# Patient Record
Sex: Female | Born: 1935 | Race: Black or African American | Hispanic: No | Smoking: Former smoker
Health system: Southern US, Community
[De-identification: ages and names within clinical notes are randomized; demographics above are authoritative.]

## PROBLEM LIST (undated history)

## (undated) DIAGNOSIS — E119 Type 2 diabetes mellitus without complications: Secondary | ICD-10-CM

## (undated) DIAGNOSIS — J45909 Unspecified asthma, uncomplicated: Secondary | ICD-10-CM

## (undated) DIAGNOSIS — I10 Essential (primary) hypertension: Secondary | ICD-10-CM

---

## 2004-11-03 ENCOUNTER — Ambulatory Visit (HOSPITAL_COMMUNITY): Admission: RE | Admit: 2004-11-03 | Discharge: 2004-11-03 | Payer: Self-pay | Admitting: Family Medicine

## 2005-01-13 ENCOUNTER — Inpatient Hospital Stay (HOSPITAL_COMMUNITY): Admission: EM | Admit: 2005-01-13 | Discharge: 2005-01-22 | Payer: Self-pay | Admitting: Emergency Medicine

## 2012-11-10 ENCOUNTER — Emergency Department (HOSPITAL_COMMUNITY): Payer: Medicare Other

## 2012-11-10 ENCOUNTER — Inpatient Hospital Stay (HOSPITAL_COMMUNITY)
Admission: EM | Admit: 2012-11-10 | Discharge: 2012-11-18 | DRG: 330 | Disposition: A | Discharge status: Home / Self Care | Payer: Medicare Other | Attending: Surgery | Admitting: Surgery

## 2012-11-10 ENCOUNTER — Encounter (HOSPITAL_COMMUNITY): Payer: Self-pay

## 2012-11-10 ENCOUNTER — Emergency Department (HOSPITAL_COMMUNITY)
Admission: EM | Admit: 2012-11-10 | Discharge: 2012-11-10 | Disposition: A | Discharge status: Nursing Home (Includes ICF) | Payer: Medicare Other | Source: Home / Self Care | Attending: Family Medicine | Admitting: Family Medicine

## 2012-11-10 ENCOUNTER — Inpatient Hospital Stay (HOSPITAL_COMMUNITY): Payer: Medicare Other

## 2012-11-10 ENCOUNTER — Encounter (HOSPITAL_COMMUNITY): Payer: Self-pay | Admitting: *Deleted

## 2012-11-10 DIAGNOSIS — R1012 Left upper quadrant pain: Secondary | ICD-10-CM

## 2012-11-10 DIAGNOSIS — K56609 Unspecified intestinal obstruction, unspecified as to partial versus complete obstruction: Secondary | ICD-10-CM | POA: Diagnosis present

## 2012-11-10 DIAGNOSIS — I10 Essential (primary) hypertension: Secondary | ICD-10-CM | POA: Diagnosis present

## 2012-11-10 DIAGNOSIS — R1011 Right upper quadrant pain: Secondary | ICD-10-CM

## 2012-11-10 DIAGNOSIS — IMO0002 Reserved for concepts with insufficient information to code with codable children: Secondary | ICD-10-CM | POA: Diagnosis present

## 2012-11-10 DIAGNOSIS — M799 Soft tissue disorder, unspecified: Secondary | ICD-10-CM | POA: Diagnosis present

## 2012-11-10 DIAGNOSIS — Z79899 Other long term (current) drug therapy: Secondary | ICD-10-CM

## 2012-11-10 DIAGNOSIS — T183XXA Foreign body in small intestine, initial encounter: Principal | ICD-10-CM | POA: Diagnosis present

## 2012-11-10 DIAGNOSIS — J4489 Other specified chronic obstructive pulmonary disease: Secondary | ICD-10-CM | POA: Diagnosis present

## 2012-11-10 DIAGNOSIS — E119 Type 2 diabetes mellitus without complications: Secondary | ICD-10-CM | POA: Diagnosis present

## 2012-11-10 DIAGNOSIS — J449 Chronic obstructive pulmonary disease, unspecified: Secondary | ICD-10-CM | POA: Diagnosis present

## 2012-11-10 HISTORY — DX: Unspecified asthma, uncomplicated: J45.909

## 2012-11-10 HISTORY — DX: Type 2 diabetes mellitus without complications: E11.9

## 2012-11-10 HISTORY — DX: Essential (primary) hypertension: I10

## 2012-11-10 LAB — CBC WITH DIFFERENTIAL/PLATELET
Basophils Relative: 0 % (ref 0–1)
Eosinophils Absolute: 0 10*3/uL (ref 0.0–0.7)
Eosinophils Relative: 0 % (ref 0–5)
HCT: 37.4 % (ref 36.0–46.0)
Hemoglobin: 11.9 g/dL — ABNORMAL LOW (ref 12.0–15.0)
Lymphocytes Relative: 4 % — ABNORMAL LOW (ref 12–46)
Lymphs Abs: 0.7 10*3/uL (ref 0.7–4.0)
MCH: 27.9 pg (ref 26.0–34.0)
MCHC: 31.8 g/dL (ref 30.0–36.0)
MCV: 87.8 fL (ref 78.0–100.0)
Monocytes Absolute: 1.2 10*3/uL — ABNORMAL HIGH (ref 0.1–1.0)
Monocytes Relative: 7 % (ref 3–12)
Neutro Abs: 16.7 10*3/uL — ABNORMAL HIGH (ref 1.7–7.7)
Platelets: 511 10*3/uL — ABNORMAL HIGH (ref 150–400)
RBC: 4.26 MIL/uL (ref 3.87–5.11)
RDW: 13.1 % (ref 11.5–15.5)
WBC: 18.6 10*3/uL — ABNORMAL HIGH (ref 4.0–10.5)

## 2012-11-10 LAB — COMPREHENSIVE METABOLIC PANEL
ALT: 16 U/L (ref 0–35)
Albumin: 3.1 g/dL — ABNORMAL LOW (ref 3.5–5.2)
Alkaline Phosphatase: 55 U/L (ref 39–117)
CO2: 26 mEq/L (ref 19–32)
Calcium: 10.5 mg/dL (ref 8.4–10.5)
Chloride: 87 mEq/L — ABNORMAL LOW (ref 96–112)
Creatinine, Ser: 2.05 mg/dL — ABNORMAL HIGH (ref 0.50–1.10)
GFR calc Af Amer: 26 mL/min — ABNORMAL LOW (ref >90)
GFR calc non Af Amer: 23 mL/min — ABNORMAL LOW (ref >90)
Glucose, Bld: 75 mg/dL (ref 70–99)
Potassium: 5 mEq/L (ref 3.5–5.1)
Sodium: 131 mEq/L — ABNORMAL LOW (ref 135–145)
Total Bilirubin: 0.4 mg/dL (ref 0.3–1.2)
Total Protein: 8.2 g/dL (ref 6.0–8.3)

## 2012-11-10 LAB — LIPASE, BLOOD: Lipase: 23 U/L (ref 11–59)

## 2012-11-10 MED ORDER — MENTHOL 3 MG MT LOZG
1.0000 | LOZENGE | OROMUCOSAL | Status: DC | PRN
  Filled 2012-11-10: qty 9

## 2012-11-10 MED ORDER — ONDANSETRON HCL 4 MG/2ML IJ SOLN
4.0000 mg | Freq: Once | INTRAMUSCULAR | Status: AC
  Administered 2012-11-10: 4 mg via INTRAVENOUS
  Filled 2012-11-10: qty 2

## 2012-11-10 MED ORDER — ALBUTEROL SULFATE HFA 108 (90 BASE) MCG/ACT IN AERS: 2.0000 | INHALATION_SPRAY | Freq: Four times a day (QID) | RESPIRATORY_TRACT | Status: DC | PRN

## 2012-11-10 MED ORDER — ONDANSETRON HCL 4 MG/2ML IJ SOLN: 4.0000 mg | Freq: Four times a day (QID) | INTRAMUSCULAR | Status: DC | PRN

## 2012-11-10 MED ORDER — SODIUM CHLORIDE 0.9 % IV SOLN
Freq: Once | INTRAVENOUS | Status: AC
  Administered 2012-11-10: 16:00:00 via INTRAVENOUS

## 2012-11-10 MED ORDER — POTASSIUM CHLORIDE IN NACL 20-0.45 MEQ/L-% IV SOLN
INTRAVENOUS | Status: DC
  Administered 2012-11-11: via INTRAVENOUS
  Filled 2012-11-10 (×2): qty 1000

## 2012-11-10 MED ORDER — DIPHENHYDRAMINE HCL 12.5 MG/5ML PO ELIX
12.5000 mg | ORAL_SOLUTION | Freq: Four times a day (QID) | ORAL | Status: DC | PRN
  Filled 2012-11-10: qty 10

## 2012-11-10 MED ORDER — HEPARIN SODIUM (PORCINE) 5000 UNIT/ML IJ SOLN
5000.0000 [IU] | Freq: Three times a day (TID) | INTRAMUSCULAR | Status: DC
  Administered 2012-11-11 – 2012-11-18 (×20): 5000 [IU] via SUBCUTANEOUS
  Filled 2012-11-10 (×27): qty 1

## 2012-11-10 MED ORDER — PANTOPRAZOLE SODIUM 40 MG IV SOLR
40.0000 mg | Freq: Every day | INTRAVENOUS | Status: DC
  Administered 2012-11-11 – 2012-11-15 (×6): 40 mg via INTRAVENOUS
  Filled 2012-11-10 (×8): qty 40

## 2012-11-10 MED ORDER — DIPHENHYDRAMINE HCL 50 MG/ML IJ SOLN: 12.5000 mg | Freq: Four times a day (QID) | INTRAMUSCULAR | Status: DC | PRN

## 2012-11-10 MED ORDER — ONDANSETRON HCL 4 MG/2ML IJ SOLN
INTRAMUSCULAR | Status: AC
  Filled 2012-11-10: qty 2

## 2012-11-10 MED ORDER — SODIUM CHLORIDE 0.9 % IV BOLUS (SEPSIS): 1000.0000 mL | Freq: Once | INTRAVENOUS | Status: DC

## 2012-11-10 MED ORDER — ONDANSETRON HCL 4 MG/2ML IJ SOLN
4.0000 mg | Freq: Once | INTRAMUSCULAR | Status: AC
  Administered 2012-11-10: 4 mg via INTRAVENOUS

## 2012-11-10 MED ORDER — MORPHINE SULFATE 2 MG/ML IJ SOLN
2.0000 mg | INTRAMUSCULAR | Status: DC | PRN
  Administered 2012-11-12: 2 mg via INTRAVENOUS
  Filled 2012-11-10: qty 1

## 2012-11-10 MED ORDER — IOHEXOL 300 MG/ML  SOLN
20.0000 mL | INTRAMUSCULAR | Status: AC
  Administered 2012-11-10: 20 mL via ORAL

## 2012-11-10 MED ORDER — INSULIN ASPART 100 UNIT/ML ~~LOC~~ SOLN
0.0000 [IU] | SUBCUTANEOUS | Status: DC
  Administered 2012-11-11: 1 [IU] via SUBCUTANEOUS
  Administered 2012-11-12: 2 [IU] via SUBCUTANEOUS
  Administered 2012-11-12 – 2012-11-13 (×3): 1 [IU] via SUBCUTANEOUS

## 2012-11-10 NOTE — ED Notes (Signed)
Patient transported to CT 

## 2012-11-10 NOTE — ED Notes (Addendum)
Patient transported to x-ray. ?

## 2012-11-10 NOTE — ED Notes (Signed)
Per [facility], pt has had abdominal pain for the past two days; pt had previous episode around thanksgiving with n/v/d; pt denying any nausea or diarrhea; pt is tachycardic with hr in the 110-120 and hypotensive for her at 105/60; pt denying bloody stools or hematuria; nad;

## 2012-11-10 NOTE — ED Notes (Signed)
Informed pt about importance of drinking oral contrast for CT; CT verbalized understanding and stated she would try to drink more;

## 2012-11-10 NOTE — ED Notes (Signed)
Pt attempted to use the bathroom but was unsuccessful. Pt ambulated to the restroom with minimal assistance.

## 2012-11-10 NOTE — ED Provider Notes (Addendum)
History     CSN: [id number]  Arrival date & time 11/10/12  1638   First MD Initiated Contact with Patient 11/10/12 1743      Chief Complaint  Patient presents with  . Abdominal Pain    (Consider location/radiation/quality/duration/timing/severity/associated sxs/prior treatment) HPI  Patient complaining of diffuse abdominal pain began three weeks ago,now constant for two days.  Patient unable to eat solids and has not had bowel movement for three days.  Abdomen is distended.  Vomited yesterday, but not today, no solids today.  No fever or chills.  No uti symptoms.  PMD Dr. Thea Silversmith.    Past Medical History  Diagnosis Date  . Diabetes mellitus without complication   . Hypertension   . Asthma     History reviewed. No pertinent past surgical history.  Family History  Problem Relation Age of Onset  . Cancer Mother   . Heart failure Father     History  Substance Use Topics  . Smoking status: Never Smoker   . Smokeless tobacco: Not on file  . Alcohol Use: No    OB History    Grav Para Term Preterm Abortions TAB SAB Ect Mult Living                  Review of Systems  Constitutional: Negative for fever, chills, activity change, appetite change and unexpected weight change.  HENT: Negative for sore throat, rhinorrhea, neck pain, neck stiffness and sinus pressure.   Eyes: Negative for visual disturbance.  Respiratory: Negative for cough and shortness of breath.   Cardiovascular: Negative for chest pain and leg swelling.  Gastrointestinal: Positive for nausea, vomiting, abdominal pain and abdominal distention. Negative for diarrhea and blood in stool.  Genitourinary: Negative for dysuria, urgency, frequency, vaginal discharge and difficulty urinating.  Musculoskeletal: Negative for myalgias, arthralgias and gait problem.  Skin: Negative for color change and rash.  Neurological: Negative for weakness, light-headedness and headaches.  Hematological: Does not bruise/bleed  easily.  Psychiatric/Behavioral: Negative for dysphoric mood.    Allergies  Penicillins  Home Medications   Current Outpatient Rx  Name  Route  Sig  Dispense  Refill  . ALBUTEROL SULFATE HFA 108 (90 BASE) MCG/ACT IN AERS   Inhalation   Inhale 2 puffs into the lungs every 6 (six) hours as needed.         Marland Kitchen GLIPIZIDE 10 MG PO TABS   Oral   Take 10 mg by mouth daily with breakfast.         . METFORMIN HCL 500 MG PO TABS   Oral   Take 500 mg by mouth daily after supper.           BP 124/76  Pulse 110  Temp 98.4 F (36.9 C) (Oral)  Resp 22  Ht 5' 5.5" (1.664 m)  Wt 175 lb (79.379 kg)  BMI 28.68 kg/m2  SpO2 96%  Physical Exam  Nursing note and vitals reviewed. Constitutional: She appears well-developed and well-nourished.  HENT:  Head: Normocephalic and atraumatic.  Eyes: Conjunctivae normal and EOM are normal. Pupils are equal, round, and reactive to light.  Neck: Normal range of motion. Neck supple.  Cardiovascular: Regular rhythm, normal heart sounds and intact distal pulses.  Tachycardia present.   Pulmonary/Chest: Effort normal and breath sounds normal.  Abdominal: She exhibits distension. There is tenderness.       No bowel sounds.   Musculoskeletal: Normal range of motion.  Neurological: She is alert.  Skin: Skin is warm and  dry.  Psychiatric: She has a normal mood and affect. Thought content normal.    ED Course  Procedures (including critical care time)  Labs Reviewed - No data to display No results found.   No diagnosis found.    MDM  7:22 PM Discussed radiograph results with Dr. Janee Morn and he will be in to evaluate.        Hilario Quarry, MD 11/10/12 [phone]  Discussed results with patient, ct pending.  She states she only has pain with movement and does not need pain medicine at this time.    Hilario Quarry, MD 11/10/12 2009

## 2012-11-10 NOTE — ED Notes (Signed)
MD at bedside. 

## 2012-11-10 NOTE — ED Notes (Signed)
1610  Report given to Medical laboratory scientific officer for [facility] by phone.

## 2012-11-10 NOTE — ED Notes (Signed)
Attempted report once again; bedside report will be done;

## 2012-11-10 NOTE — ED Notes (Signed)
Attempted to call report for second time 

## 2012-11-10 NOTE — ED Notes (Signed)
C/o vomiting all day Thanksgiving got better and got sick again 2 days ago.  Vomited 2 days ago x 1, does not remember if she vomited yesterday none today.  C/o pain in epigastric area and LUQ abd.  C/o hiccups frequently for 1 week.  No chills or fever,  No blood in vomit or stools.  No diarrhea.

## 2012-11-10 NOTE — ED Provider Notes (Addendum)
History     CSN: [id number]  Arrival date & time 11/10/12  1517   First MD Initiated Contact with Patient 11/10/12 1540      Chief Complaint  Patient presents with  . Abdominal Pain    (Consider location/radiation/quality/duration/timing/severity/associated sxs/prior treatment) Patient is a 77 y.o. female presenting with abdominal pain. The history is provided by the patient.  Abdominal Pain The primary symptoms of the illness include abdominal pain, vomiting and diarrhea. The primary symptoms of the illness do not include fever, hematemesis, hematochezia or dysuria. The current episode started 2 days ago (similar hx over Thanksgiving with persistent vomiting, improved a little but relapsed 2 d ago., persistent anorexia, only tolerating warm milk.Marland Kitchen). The onset of the illness was sudden. The problem has been gradually worsening.  Symptoms associated with the illness do not include chills.    No past medical history on file.  No past surgical history on file.  No family history on file.  History  Substance Use Topics  . Smoking status: Not on file  . Smokeless tobacco: Not on file  . Alcohol Use: Not on file    OB History    No data available      Review of Systems  Constitutional: Positive for appetite change. Negative for fever and chills.  Cardiovascular: Negative for chest pain.  Gastrointestinal: Positive for vomiting, abdominal pain and diarrhea. Negative for blood in stool, hematochezia and hematemesis.  Genitourinary: Negative.  Negative for dysuria.    Allergies  Review of patient's allergies indicates not on file.  Home Medications  No current outpatient prescriptions on file.  BP 105/61  Pulse 119  Temp 98.5 F (36.9 C) (Oral)  Resp 20  SpO2 97%  Physical Exam  Nursing note and vitals reviewed. Constitutional: She is oriented to person, place, and time. She appears well-developed and well-nourished. She appears distressed.  HENT:  Mouth/Throat:  Oropharynx is clear and moist.  Eyes: Conjunctivae normal are normal. Pupils are equal, round, and reactive to light.  Neck: Normal range of motion. Neck supple.  Cardiovascular: Regular rhythm.   Abdominal: She exhibits no distension and no mass. There is no hepatosplenomegaly. There is tenderness in the epigastric area and left upper quadrant. There is guarding. There is no rebound, no CVA tenderness, no tenderness at McBurney's point and negative Murphy's sign.  Musculoskeletal: She exhibits no edema and no tenderness.  Neurological: She is alert and oriented to person, place, and time.  Skin: Skin is warm and dry.    ED Course  Procedures (including critical care time)  Labs Reviewed - No data to display No results found.   1. Abdominal pain, bilateral upper quadrant       MDM          Linna Hoff, MD 11/10/12 1559  Linna Hoff, MD 11/10/12 [phone]

## 2012-11-10 NOTE — ED Notes (Addendum)
IV @ KVO after Zofran infused.  [facility] here.  Daughter notified of transfer and is at the bedside.  Given her belongings.

## 2012-11-10 NOTE — ED Notes (Signed)
Pt back from CT

## 2012-11-10 NOTE — ED Notes (Signed)
Returned from radiology. 

## 2012-11-10 NOTE — H&P (Signed)
Dawn [name] is an 77 y.o. female.   Chief Complaint: Abdominal pain and distention HPI: Patient has a three-week history of crampy abdominal pain. It was intermittent. For the past few days, it has increased in frequency. She has been passing gas. She has not had a bowel movement for a couple days. She noticed her abdomen become distended. She went to urgent care and was referred to the [facility] emergency department. Plain films show small bowel obstruction I was asked to see her in the emergency department. She denies having surgery in the past.  Past Medical History  Diagnosis Date  . Diabetes mellitus without complication   . Hypertension   . Asthma     History reviewed. No pertinent past surgical history.  Family History  Problem Relation Age of Onset  . Cancer Mother   . Heart failure Father    Social History:  reports that she has never smoked. She does not have any smokeless tobacco history on file. She reports that she does not drink alcohol or use illicit drugs.  Allergies:  Allergies  Allergen Reactions  . Penicillins     Got sick and couldn't breathe     (Not in a hospital admission)  Results for orders placed during the hospital encounter of 11/10/12 (from the past 48 hour(s))  CBC WITH DIFFERENTIAL     Status: Abnormal   Collection Time   11/10/12  6:02 PM      Component Value Range Comment   WBC 18.6 (*) 4.0 - 10.5 K/uL    RBC 4.26  3.87 - 5.11 MIL/uL    Hemoglobin 11.9 (*) 12.0 - 15.0 g/dL    HCT 16.1  09.6 - 04.5 %    MCV 87.8  78.0 - 100.0 fL    MCH 27.9  26.0 - 34.0 pg    MCHC 31.8  30.0 - 36.0 g/dL    RDW 40.9  81.1 - 91.4 %    Platelets 511 (*) 150 - 400 K/uL    Neutrophils Relative 90 (*) 43 - 77 %    Neutro Abs 16.7 (*) 1.7 - 7.7 K/uL    Lymphocytes Relative 4 (*) 12 - 46 %    Lymphs Abs 0.7  0.7 - 4.0 K/uL    Monocytes Relative 7  3 - 12 %    Monocytes Absolute 1.2 (*) 0.1 - 1.0 K/uL    Eosinophils Relative 0  0 - 5 %    Eosinophils  Absolute 0.0  0.0 - 0.7 K/uL    Basophils Relative 0  0 - 1 %    Basophils Absolute 0.0  0.0 - 0.1 K/uL   COMPREHENSIVE METABOLIC PANEL     Status: Abnormal   Collection Time   11/10/12  6:02 PM      Component Value Range Comment   Sodium 131 (*) 135 - 145 mEq/L    Potassium 5.0  3.5 - 5.1 mEq/L    Chloride 87 (*) 96 - 112 mEq/L    CO2 26  19 - 32 mEq/L    Glucose, Bld 75  70 - 99 mg/dL    BUN 44 (*) 6 - 23 mg/dL    Creatinine, Ser 7.82 (*) 0.50 - 1.10 mg/dL    Calcium 95.6  8.4 - 10.5 mg/dL    Total Protein 8.2  6.0 - 8.3 g/dL    Albumin 3.1 (*) 3.5 - 5.2 g/dL    AST 17  0 - 37 U/L  ALT 16  0 - 35 U/L    Alkaline Phosphatase 55  39 - 117 U/L    Total Bilirubin 0.4  0.3 - 1.2 mg/dL    GFR calc non Af Amer 23 (*) >90 mL/min    GFR calc Af Amer 26 (*) >90 mL/min   LIPASE, BLOOD     Status: Normal   Collection Time   11/10/12  6:02 PM      Component Value Range Comment   Lipase 23  11 - 59 U/L    Dg Abd Acute W/chest  11/10/2012  *RADIOLOGY REPORT*  Clinical Data: Abdominal pain and weakness  ACUTE ABDOMEN SERIES (ABDOMEN 2 VIEW & CHEST 1 VIEW)  Comparison: Chest 01/20/2005  Findings: Small bowel dilatation with air-fluid levels compatible with small bowel obstruction.  Negative for free air.  Colon is decompressed with a small amount of gas in the rectum.  Left lower lobe density is present which is most likely due to a Bochdalek hernia as seen on prior lateral chest x-rays.  There is mild left lower lobe atelectasis.  Right lung is clear.  Negative for heart failure.  IMPRESSION: Left lower lobe density due to atelectasis and the posterior diaphragmatic hernia.  Small bowel obstruction   Original Report Authenticated By: Janeece Riggers, M.D.     Review of Systems  Constitutional: Negative.   HENT: Negative.   Eyes: Negative.   Respiratory: Negative.   Cardiovascular: Negative.   Gastrointestinal: Positive for nausea, vomiting and abdominal pain.  Genitourinary:       Decreased  urinary frequency for the past day  Musculoskeletal:       Long history left forearm soft tissue mass  Skin: Negative.   Neurological: Negative.   Endo/Heme/Allergies: Negative.     Blood pressure 85/60, [name] 107, temperature 98.4 F (36.9 C), temperature source Oral, resp. rate 27, height 5' 5.5" (1.664 m), weight 79.379 kg (175 lb), SpO2 96.00%. Physical Exam  Constitutional: She is oriented to person, place, and time. She appears well-developed and well-nourished. No distress.  HENT:  Head: Normocephalic and atraumatic.  Mouth/Throat: No oropharyngeal exudate.  Eyes: EOM are normal. Pupils are equal, round, and reactive to light. No scleral icterus.  Neck: Normal range of motion. Neck supple. No tracheal deviation present.  Cardiovascular: Regular rhythm, normal heart sounds and intact distal pulses.   No murmur heard.      Heart rate 108  Respiratory: Effort normal and breath sounds normal. No stridor. No respiratory distress. She has no wheezes. She has no rales.  GI: Soft. She exhibits distension. There is tenderness. There is no rebound and no guarding.       Mild tenderness left side greater than right side, no guarding, active bowel sounds are present  Musculoskeletal:       3 cm soft tissue mass left forearm  Neurological: She is alert and oriented to person, place, and time.  Skin: Skin is warm and dry.     Assessment/Plan Small bowel obstruction. CT scan is ordered, will followup on that. Will admit to the hospital, place nasogastric tube to low intermittent suction, rehydrate with IV fluids as I believe she is dehydrated. We will follow her exam and laboratory studies closely. I explained to her she may need surgery if she does not improve over the next 24-48 hours. I also spoke with her family members. I answered their questions.  Etta Gassett E 11/10/2012, 8:00 PM

## 2012-11-10 NOTE — ED Notes (Signed)
Family at bedside. 

## 2012-11-10 NOTE — ED Notes (Signed)
Bedside report given to Misty Stanley, [place]

## 2012-11-11 ENCOUNTER — Encounter (HOSPITAL_COMMUNITY): Payer: Self-pay | Admitting: *Deleted

## 2012-11-11 LAB — CBC
MCH: 28.7 pg (ref 26.0–34.0)
MCHC: 32.8 g/dL (ref 30.0–36.0)
MCV: 87.3 fL (ref 78.0–100.0)
Platelets: 498 10*3/uL — ABNORMAL HIGH (ref 150–400)

## 2012-11-11 LAB — COMPREHENSIVE METABOLIC PANEL
ALT: 12 U/L (ref 0–35)
AST: 15 U/L (ref 0–37)
Albumin: 2.7 g/dL — ABNORMAL LOW (ref 3.5–5.2)
Alkaline Phosphatase: 53 U/L (ref 39–117)
CO2: 26 mEq/L (ref 19–32)
Chloride: 90 mEq/L — ABNORMAL LOW (ref 96–112)
GFR calc non Af Amer: 24 mL/min — ABNORMAL LOW (ref >90)
Potassium: 5 mEq/L (ref 3.5–5.1)
Sodium: 128 mEq/L — ABNORMAL LOW (ref 135–145)
Total Bilirubin: 0.5 mg/dL (ref 0.3–1.2)

## 2012-11-11 LAB — URINALYSIS, ROUTINE W REFLEX MICROSCOPIC
Hgb urine dipstick: NEGATIVE
Ketones, ur: NEGATIVE mg/dL
Nitrite: NEGATIVE
Protein, ur: NEGATIVE mg/dL
Specific Gravity, Urine: 1.023 (ref 1.005–1.030)
Urobilinogen, UA: 1 mg/dL (ref 0.0–1.0)

## 2012-11-11 LAB — URINE MICROSCOPIC-ADD ON

## 2012-11-11 LAB — GLUCOSE, CAPILLARY
Glucose-Capillary: 57 mg/dL — ABNORMAL LOW (ref 70–99)
Glucose-Capillary: 174 mg/dL — ABNORMAL HIGH (ref 70–99)
Glucose-Capillary: 98 mg/dL (ref 70–99)
Glucose-Capillary: 113 mg/dL — ABNORMAL HIGH (ref 70–99)
Glucose-Capillary: 135 mg/dL — ABNORMAL HIGH (ref 70–99)

## 2012-11-11 LAB — PROTIME-INR: Prothrombin Time: 13.8 seconds (ref 11.6–15.2)

## 2012-11-11 MED ORDER — KCL IN DEXTROSE-NACL 20-5-0.45 MEQ/L-%-% IV SOLN
INTRAVENOUS | Status: DC
  Administered 2012-11-11 (×2): via INTRAVENOUS
  Filled 2012-11-11 (×7): qty 1000

## 2012-11-11 MED ORDER — DEXTROSE 50 % IV SOLN
50.0000 mL | Freq: Once | INTRAVENOUS | Status: AC
  Administered 2012-11-11: 50 mL via INTRAVENOUS

## 2012-11-11 MED ORDER — PNEUMOCOCCAL VAC POLYVALENT 25 MCG/0.5ML IJ INJ
0.5000 mL | INJECTION | INTRAMUSCULAR | Status: AC
  Administered 2012-11-13: 0.5 mL via INTRAMUSCULAR
  Filled 2012-11-11: qty 0.5

## 2012-11-11 NOTE — Progress Notes (Signed)
INITIAL NUTRITION ASSESSMENT  DOCUMENTATION CODES Per approved criteria  -Not Applicable   INTERVENTION: Monitor patient status and diet advancement as appropriate per MD.    NUTRITION DIAGNOSIS: Inadequate oral intake related to altered GI function as evidenced by npo status.   Goal: Tolerate diet advancement as appropriate  Monitor:  Diet advancement, labs, weight, nutrition plan of care  Reason for Assessment: Low Braden  77 y.o. female  Admitting Dx: SBO with recommendations for surgery if not better by am.  ASSESSMENT: Sleeping, pt with a 3 week history of crampy abdominal pain.  Well nourished prior to this event.  NPO with NG tube in place.  Height: Ht Readings from Last 1 Encounters:  11/10/12 5' 5.5" (1.664 m)    Weight: Wt Readings from Last 1 Encounters:  11/10/12 175 lb (79.379 kg)    Ideal Body Weight: 127.5 lb  % Ideal Body Weight: 137  Wt Readings from Last 10 Encounters:  11/10/12 175 lb (79.379 kg)    Usual Body Weight: unknown  % Usual Body Weight: unknown  BMI:  Body mass index is 28.68 kg/(m^2).  Estimated Nutritional Needs: Kcal: 1600-1800 Protein: 85-95 gm Fluid: 1.6-1.8L  Diet Order: NPO  EDUCATION NEEDS: -No education needs identified at this time   Intake/Output Summary (Last 24 hours) at 11/11/12 1452 Last data filed at 11/11/12 1404  Gross per 24 hour  Intake 1414.5 ml  Output    500 ml  Net  914.5 ml    Last BM: none  Labs:   Lab 11/11/12 0620 11/10/12 1802  NA 128* 131*  K 5.0 5.0  CL 90* 87*  CO2 26 26  BUN 49* 44*  CREATININE 1.93* 2.05*  CALCIUM 9.3 10.5  MG -- --  PHOS -- --  GLUCOSE 109* 75    CBG (last 3)   Basename 11/11/12 1118 11/11/12 0906 11/11/12 0517  GLUCAP 98 68* 174*    Scheduled Meds:   . heparin  5,000 Units Subcutaneous Q8H  . insulin aspart  0-9 Units Subcutaneous Q4H  . pantoprazole (PROTONIX) IV  40 mg Intravenous QHS  . pneumococcal 23 valent vaccine  0.5 mL  Intramuscular Tomorrow-1000  . sodium chloride  1,000 mL Intravenous Once    Continuous Infusions:   . dextrose 5 % and 0.45 % NaCl with KCl 20 mEq/L 125 mL/hr at 11/11/12 1610    Past Medical History  Diagnosis Date  . Diabetes mellitus without complication   . Hypertension   . Asthma     History reviewed. No pertinent past surgical history.  Oran Rein, RD, LDN Clinical Inpatient Dietitian Pager:  [phone] Weekend and after hours pager:  [phone]

## 2012-11-11 NOTE — Clinical Documentation Improvement (Signed)
RENAL FAILURE DOCUMENTATION CLARIFICATION QUERY  THIS DOCUMENT IS NOT A PERMANENT PART OF THE MEDICAL RECORD  TO RESPOND TO THE THIS QUERY, FOLLOW THE INSTRUCTIONS BELOW:  1. If needed, update documentation for the patient's encounter via the notes activity.  2. Access this query again and click edit on the In [employer].  3. After updating, or not, click F2 to complete all highlighted (required) fields concerning your review. Select "additional documentation in the medical record" OR "no additional documentation provided".  4. Click Sign note button.  5. The deficiency will fall out of your In Basket *Please let us know if you are not able to complete this workflow by phone or e-mail (listed below).  Please update your documentation within the medical record to reflect your response to this query.                                                                                    11/11/12  Dear Dr. Romeo Apple [facility]/ [facility]   In a better effort to capture your patient's severity of illness, reflect appropriate length of stay and utilization of resources, a review of the patient medical record has revealed the following indicators.    Based on your clinical judgment, please clarify and document in a progress note and/or discharge summary the clinical condition associated with the following supporting information:  In responding to this query please exercise your independent judgment.  The fact that a query is asked, does not imply that any particular answer is desired or expected.  Patient's BUN/CRT  44/ 2.05 and 49/ 1.93 on admit.  Please document the appropriate secondary diagnosis.  Thank you   Possible Clinical Conditions?   Acute Renal Failure  Acute Kidney Injury  Acute on Chronic Renal Failure  Chronic Renal Failure  Other Condition  Cannot Clinically Determine      Risk Factors: Diabetes, HTN  Signs and Symptoms:" Decreased urinary frequency for the past  day "     Lab: CMP   Current Creatinine Level (trend): 1.93 (H) 2.05   Current BUN Level (trend): 49 (H) 44 (H)  Electrolytes: Na 128/ 131   GFR: Af Amr  28 (L) 90 mL/min" 26 (   Treatments: D 5 1/2 NS w  KCL @ 125 ml /hr                     You may use possible, probable, or suspect with inpatient documentation. possible, probable, suspected diagnoses MUST be documented at the time of discharge  Reviewed:  no additional documentation provided  Thank You,  Lavonda Jumbo  Clinical Documentation Specialist, BSN: Pager [phone]  Health Information Management [place]

## 2012-11-11 NOTE — Clinical Documentation Improvement (Signed)
Abnormal Labs Clarification  THIS DOCUMENT IS NOT A PERMANENT PART OF THE MEDICAL RECORD  TO RESPOND TO THE THIS QUERY, FOLLOW THE INSTRUCTIONS BELOW:  1. If needed, update documentation for the patient's encounter via the notes activity.  2. Access this query again and click edit on the [employer].  3. After updating, or not, click F2 to complete all highlighted (required) fields concerning your review. Select "additional documentation in the medical record" OR "no additional documentation provided".  4. Click Sign note button.  5. The deficiency will fall out of your InBasket *Please let us know if you are not able to complete this workflow by phone or e-mail (listed below).  Please update your documentation within the medical record to reflect your response to this query.                                                                                   11/11/12  Dear Dr. Romeo Apple [facility] [place]/  [facility]  In a better effort to capture your patient's severity of illness, reflect appropriate length of stay and utilization of resources, a review of the medical record has revealed the following indicators.    Based on your clinical judgment, please clarify and document in a progress note and/or discharge summary the clinical condition associated with the following supporting information:  In responding to this query please exercise your independent judgment.  The fact that a query is asked, does not imply that any particular answer is desired or expected.  Abnormal findings (laboratory, x-ray, pathologic, and other diagnostic results) are not coded and reported unless the physician indicates their clinical significance.   The medical record reflects the following clinical findings, please clarify the diagnostic and/or clinical significance:      Noted Sodium level 128/ 131.  Please document appropriate secondary diagnosis. Thank you  Possible Clinical Conditions?                                    Hyponatremia  SIADH  Abnormal lab value   Other Condition:             Cannot Clinically Determine:  Treatment: D 5 1/2 NS w 20  KCL @ 125 ml/hr    Reviewed:  no additional documentation provided I think she is just dehydrated and would not classify this as renal failure  Thank You,  Lavonda Jumbo  Clinical Documentation Specialist, BSN: Pager [phone]  Health Information Management [place]

## 2012-11-11 NOTE — Progress Notes (Signed)
Patient ID: Dawn [name], female   DOB: Jan 16, 1935, 77 y.o.   MRN: [id number]    Subjective: Feels "pretty good" although on questioning states her abdominal pain is about the same.  No flatus or BM  Objective: Vital signs in last 24 hours: Temp:  [98.1 F (36.7 C)-98.5 F (36.9 C)] 98.1 F (36.7 C) (12/20 0520) Pulse Rate:  [54-120] 60  (12/20 1112) Resp:  [16-29] 20  (12/20 1112) BP: (85-126)/(51-89) 113/54 mmHg (12/20 1112) SpO2:  [93 %-100 %] 96 % (12/20 0520) Weight:  [175 lb (79.379 kg)] 175 lb (79.379 kg) (12/19 1639) Last BM Date: 11/09/12  Intake/Output from previous day: 12/19 0701 - 12/20 0700 In: 1414.5 [I.V.:662.5; NG/GT:750; IV Piggyback:2] Out: 200 [Urine:200] Intake/Output this shift: Adequate UOP today  General appearance: alert, cooperative and no distress GI: Moderately distended, mild tenderness diffusely with out guarding  Lab Results:   [facility] 11/11/12 0620 11/10/12 1802  WBC 17.1* 18.6*  HGB 11.3* 11.9*  HCT 34.4* 37.4  PLT 498* 511*   BMET  Basename 11/11/12 0620 11/10/12 1802  NA 128* 131*  K 5.0 5.0  CL 90* 87*  CO2 26 26  GLUCOSE 109* 75  BUN 49* 44*  CREATININE 1.93* 2.05*  CALCIUM 9.3 10.5     Studies/Results: Ct Abdomen Pelvis Wo Contrast  11/10/2012  *RADIOLOGY REPORT*  Clinical Data: Abdominal pain  CT ABDOMEN AND PELVIS WITHOUT CONTRAST  Technique:  Multidetector CT imaging of the abdomen and pelvis was performed following the standard protocol without intravenous contrast.  Comparison: 11/10/2012 radiograph  Findings: Linear opacities at the lung bases, most in keeping with atelectasis or scarring.  Heart size within normal limits. Coronary artery calcification. Small left Bochdalek hernia.  No pleural or pericardial effusion.  Organ abnormality/lesion detection is limited in the absence of intravenous contrast. Within this limitation, unremarkable liver, spleen, pancreas, adrenal glands, kidneys.  No hydronephrosis or  hydroureter.  Colonic diverticulosis.  The colon is relatively decompressed as are the distal small bowel loops.  Proximal small bowel loops are dilated with air-fluid levels, measuring up to 4.9 cm.  Transition point within the midline pelvis as seen on series 2 image 69, with a fecalized small bowel loop. There is a small amount of adjacent mesenteric fluid or stranding as seen on image 62.  Nonspecific thickening along the anti dependent gastric fundal wall as seen on coronal image 66  There is a small amount of perihepatic fluid that measures water density and a small amount of fluid within the right paracolic gutter and pelvis, measuring slightly higher than that of simple fluid. Left pericolic gutter fluid is nonspecific as the has a mildly nodular appearance.  No free intraperitoneal air identified.  No lymphadenopathy.  There is scattered atherosclerotic calcification of the aorta and its branches. No aneurysmal dilatation.  Thin-walled bladder.  Nonspecific uterine calcifications.  No adnexal mass.  Bilateral SI joint degenerative changes.  Multilevel degenerative changes and osteopenia.  IMPRESSION: Small bowel obstruction with transition in the pelvis.  There is a small amount of free intraperitoneal fluid which is nonspecific and may be reactive.  Recommend surgical consultation.  Left paracolic gutter stranding / fluid may also be secondary to the above process, however, has a mildly nodular configuration. Recommend attention on follow-up.  Intraluminal soft tissue attenuation along the gastric fundus may be ingested contents.  EGD follow-up can be obtained to exclude a mucosal lesion.   Original Report Authenticated By: Jearld Lesch, M.D.  Dg Abd Acute W/chest  11/10/2012  *RADIOLOGY REPORT*  Clinical Data: Abdominal pain and weakness  ACUTE ABDOMEN SERIES (ABDOMEN 2 VIEW & CHEST 1 VIEW)  Comparison: Chest 01/20/2005  Findings: Small bowel dilatation with air-fluid levels compatible with  small bowel obstruction.  Negative for free air.  Colon is decompressed with a small amount of gas in the rectum.  Left lower lobe density is present which is most likely due to a Bochdalek hernia as seen on prior lateral chest x-rays.  There is mild left lower lobe atelectasis.  Right lung is clear.  Negative for heart failure.  IMPRESSION: Left lower lobe density due to atelectasis and the posterior diaphragmatic hernia.  Small bowel obstruction   Original Report Authenticated By: Janeece Riggers, M.D.     Anti-infectives: Anti-infectives    None      Assessment/Plan: SBO, ? Etiology with no Hx of previous surgery Stable but does not appear to be opening up Will observe today but discussed with pt that I would recommend surgery if she is not clearly better in the AM    LOS: 1 day    Anitha Kreiser T 11/11/2012

## 2012-11-11 NOTE — Progress Notes (Signed)
UR completed 

## 2012-11-12 ENCOUNTER — Inpatient Hospital Stay (HOSPITAL_COMMUNITY): Payer: Medicare Other | Admitting: Certified Registered"

## 2012-11-12 ENCOUNTER — Encounter (HOSPITAL_COMMUNITY): Admission: EM | Disposition: A | Discharge status: Home / Self Care | Payer: Self-pay | Source: Home / Self Care

## 2012-11-12 ENCOUNTER — Encounter (HOSPITAL_COMMUNITY): Payer: Self-pay | Admitting: Certified Registered"

## 2012-11-12 ENCOUNTER — Inpatient Hospital Stay (HOSPITAL_COMMUNITY): Payer: Medicare Other

## 2012-11-12 HISTORY — PX: LAPAROTOMY: SHX154

## 2012-11-12 HISTORY — PX: BOWEL RESECTION: SHX1257

## 2012-11-12 LAB — GLUCOSE, CAPILLARY
Glucose-Capillary: 118 mg/dL — ABNORMAL HIGH (ref 70–99)
Glucose-Capillary: 129 mg/dL — ABNORMAL HIGH (ref 70–99)
Glucose-Capillary: 134 mg/dL — ABNORMAL HIGH (ref 70–99)
Glucose-Capillary: 136 mg/dL — ABNORMAL HIGH (ref 70–99)

## 2012-11-12 LAB — CBC
MCH: 28.7 pg (ref 26.0–34.0)
MCHC: 32.9 g/dL (ref 30.0–36.0)
MCV: 87.4 fL (ref 78.0–100.0)
Platelets: 472 10*3/uL — ABNORMAL HIGH (ref 150–400)
RBC: 4.04 MIL/uL (ref 3.87–5.11)

## 2012-11-12 LAB — BASIC METABOLIC PANEL
CO2: 25 mEq/L (ref 19–32)
Calcium: 8.8 mg/dL (ref 8.4–10.5)
Creatinine, Ser: 1.21 mg/dL — ABNORMAL HIGH (ref 0.50–1.10)
GFR calc non Af Amer: 43 mL/min — ABNORMAL LOW (ref >90)

## 2012-11-12 LAB — SURGICAL PCR SCREEN: MRSA, PCR: NEGATIVE

## 2012-11-12 SURGERY — LAPAROTOMY, EXPLORATORY
Anesthesia: General | Site: Abdomen | Wound class: Clean

## 2012-11-12 MED ORDER — LACTATED RINGERS IV SOLN
INTRAVENOUS | Status: DC | PRN
  Administered 2012-11-12: 12:00:00 via INTRAVENOUS

## 2012-11-12 MED ORDER — PROPOFOL 10 MG/ML IV BOLUS
INTRAVENOUS | Status: DC | PRN
  Administered 2012-11-12: 100 mg via INTRAVENOUS

## 2012-11-12 MED ORDER — ALBUMIN HUMAN 5 % IV SOLN
INTRAVENOUS | Status: DC | PRN
  Administered 2012-11-12: 13:00:00 via INTRAVENOUS

## 2012-11-12 MED ORDER — METRONIDAZOLE IN NACL 5-0.79 MG/ML-% IV SOLN
500.0000 mg | Freq: Once | INTRAVENOUS | Status: AC
  Administered 2012-11-12: 500 mg via INTRAVENOUS
  Filled 2012-11-12: qty 100

## 2012-11-12 MED ORDER — MORPHINE SULFATE 2 MG/ML IJ SOLN
2.0000 mg | INTRAMUSCULAR | Status: DC | PRN
  Administered 2012-11-12: 2 mg via INTRAVENOUS
  Filled 2012-11-12: qty 1

## 2012-11-12 MED ORDER — PHENYLEPHRINE HCL 10 MG/ML IJ SOLN
10.0000 mg | INTRAVENOUS | Status: DC | PRN
  Administered 2012-11-12: 40 ug/min via INTRAVENOUS

## 2012-11-12 MED ORDER — SODIUM CHLORIDE 0.9 % IV SOLN
INTRAVENOUS | Status: DC | PRN
  Administered 2012-11-12 (×2): via INTRAVENOUS

## 2012-11-12 MED ORDER — SUCCINYLCHOLINE CHLORIDE 20 MG/ML IJ SOLN
INTRAMUSCULAR | Status: DC | PRN
  Administered 2012-11-12: 100 mg via INTRAVENOUS

## 2012-11-12 MED ORDER — CIPROFLOXACIN IN D5W 400 MG/200ML IV SOLN
INTRAVENOUS | Status: DC | PRN
  Administered 2012-11-12: 400 mg via INTRAVENOUS

## 2012-11-12 MED ORDER — FENTANYL CITRATE 0.05 MG/ML IJ SOLN
INTRAMUSCULAR | Status: DC | PRN
  Administered 2012-11-12 (×5): 50 ug via INTRAVENOUS

## 2012-11-12 MED ORDER — ONDANSETRON HCL 4 MG/2ML IJ SOLN: 4.0000 mg | Freq: Once | INTRAMUSCULAR | Status: AC | PRN

## 2012-11-12 MED ORDER — POTASSIUM CHLORIDE IN NACL 20-0.9 MEQ/L-% IV SOLN
INTRAVENOUS | Status: DC
  Administered 2012-11-12 – 2012-11-17 (×6): via INTRAVENOUS
  Filled 2012-11-12 (×15): qty 1000

## 2012-11-12 MED ORDER — HYDROMORPHONE HCL PF 1 MG/ML IJ SOLN
0.2500 mg | INTRAMUSCULAR | Status: DC | PRN
  Administered 2012-11-12: 0.25 mg via INTRAVENOUS

## 2012-11-12 MED ORDER — 0.9 % SODIUM CHLORIDE (POUR BTL) OPTIME
TOPICAL | Status: DC | PRN
  Administered 2012-11-12 (×2): 1000 mL

## 2012-11-12 MED ORDER — SODIUM CHLORIDE 0.9 % IV SOLN
INTRAVENOUS | Status: DC | PRN
  Administered 2012-11-12 (×2): via INTRAVENOUS

## 2012-11-12 MED ORDER — LIDOCAINE HCL (CARDIAC) 20 MG/ML IV SOLN
INTRAVENOUS | Status: DC | PRN
  Administered 2012-11-12: 100 mg via INTRAVENOUS

## 2012-11-12 SURGICAL SUPPLY — 52 items
BLADE SURG ROTATE 9660 (MISCELLANEOUS) IMPLANT
CANISTER SUCTION 2500CC (MISCELLANEOUS) ×2 IMPLANT
CELLS DAT CNTRL 66122 CELL SVR (MISCELLANEOUS) ×1 IMPLANT
CHLORAPREP W/TINT 26ML (MISCELLANEOUS) ×2 IMPLANT
CLOTH BEACON ORANGE TIMEOUT ST (SAFETY) ×2 IMPLANT
COVER SURGICAL LIGHT HANDLE (MISCELLANEOUS) ×2 IMPLANT
DRAPE LAPAROSCOPIC ABDOMINAL (DRAPES) ×2 IMPLANT
DRAPE PROXIMA HALF (DRAPES) ×1 IMPLANT
DRAPE UTILITY 15X26 W/TAPE STR (DRAPE) ×4 IMPLANT
DRAPE WARM FLUID 44X44 (DRAPE) ×2 IMPLANT
ELECT BLADE 6.5 EXT (BLADE) IMPLANT
ELECT REM PT RETURN 9FT ADLT (ELECTROSURGICAL) ×2
ELECTRODE REM PT RTRN 9FT ADLT (ELECTROSURGICAL) ×1 IMPLANT
GLOVE BIO SURGEON STRL SZ7.5 (GLOVE) ×3 IMPLANT
GLOVE BIOGEL PI IND STRL 8 (GLOVE) ×1 IMPLANT
GLOVE BIOGEL PI INDICATOR 8 (GLOVE) ×1
GLOVE SS BIOGEL STRL SZ 7.5 (GLOVE) ×1 IMPLANT
GLOVE SUPERSENSE BIOGEL SZ 7.5 (GLOVE) ×1
GLOVE SURG SS PI 7.5 STRL IVOR (GLOVE) ×1 IMPLANT
GOWN PREVENTION PLUS XLARGE (GOWN DISPOSABLE) ×2 IMPLANT
GOWN STRL NON-REIN LRG LVL3 (GOWN DISPOSABLE) ×4 IMPLANT
KIT BASIN OR (CUSTOM PROCEDURE TRAY) ×2 IMPLANT
KIT ROOM TURNOVER OR (KITS) ×2 IMPLANT
LIGASURE IMPACT 36 18CM CVD LR (INSTRUMENTS) ×1 IMPLANT
NS IRRIG 1000ML POUR BTL (IV SOLUTION) ×4 IMPLANT
PACK GENERAL/GYN (CUSTOM PROCEDURE TRAY) ×2 IMPLANT
PAD ARMBOARD 7.5X6 YLW CONV (MISCELLANEOUS) ×2 IMPLANT
RELOAD LINEAR CUT PROX 55 BLUE (ENDOMECHANICALS) ×4 IMPLANT
RELOAD STAPLE 55 3.8 BLU REG (ENDOMECHANICALS) IMPLANT
RETRACTOR WND ALEXIS 18 MED (MISCELLANEOUS) IMPLANT
RTRCTR WOUND ALEXIS 18CM MED (MISCELLANEOUS) ×2
SPECIMEN JAR X LARGE (MISCELLANEOUS) IMPLANT
SPONGE GAUZE 4X4 12PLY (GAUZE/BANDAGES/DRESSINGS) ×2 IMPLANT
SPONGE LAP 18X18 X RAY DECT (DISPOSABLE) IMPLANT
STAPLER PROXIMATE 55 BLUE (STAPLE) ×1 IMPLANT
STAPLER VISISTAT 35W (STAPLE) ×2 IMPLANT
SUCTION POOLE TIP (SUCTIONS) ×1 IMPLANT
SUT CHROMIC 3 0 SH 27 (SUTURE) ×2 IMPLANT
SUT NOVA 1 T20/GS 25DT (SUTURE) IMPLANT
SUT NOVA NAB GS-21 0 18 T12 DT (SUTURE) IMPLANT
SUT PDS AB 1 CTX 36 (SUTURE) IMPLANT
SUT PDS AB 1 TP1 96 (SUTURE) ×2 IMPLANT
SUT SILK 2 0 SH CR/8 (SUTURE) ×2 IMPLANT
SUT SILK 2 0 TIES 10X30 (SUTURE) ×2 IMPLANT
SUT SILK 3 0 SH CR/8 (SUTURE) ×2 IMPLANT
SUT SILK 3 0 TIES 10X30 (SUTURE) ×2 IMPLANT
TAPE CLOTH SURG 6X10 WHT LF (GAUZE/BANDAGES/DRESSINGS) ×1 IMPLANT
TOWEL OR 17X24 6PK STRL BLUE (TOWEL DISPOSABLE) ×2 IMPLANT
TOWEL OR 17X26 10 PK STRL BLUE (TOWEL DISPOSABLE) ×2 IMPLANT
TRAY FOLEY CATH 14FRSI W/METER (CATHETERS) IMPLANT
WATER STERILE IRR 1000ML POUR (IV SOLUTION) ×2 IMPLANT
YANKAUER SUCT BULB TIP NO VENT (SUCTIONS) IMPLANT

## 2012-11-12 NOTE — Transfer of Care (Signed)
Immediate Anesthesia Transfer of Care Note  Patient: Dawn [name]  Procedure(s) Performed: Procedure(s) (LRB) with comments: EXPLORATORY LAPAROTOMY (N/A) SMALL BOWEL RESECTION (N/A)  Patient Location: PACU  Anesthesia Type:General  Level of Consciousness: sedated  Airway & Oxygen Therapy: Patient Spontanous Breathing and Patient connected to nasal cannula oxygen  Post-op Assessment: Report given to PACU RN and Post -op Vital signs reviewed and stable  Post vital signs: Reviewed and stable  Complications: No apparent anesthesia complications

## 2012-11-12 NOTE — Anesthesia Preprocedure Evaluation (Addendum)
Anesthesia Evaluation  Patient identified by MRN, date of birth, ID band Patient awake    Airway Mallampati: I TM Distance: >3 FB Neck ROM: full    Dental  (+) Dental Advisory Given   Pulmonary asthma , COPD COPD inhaler,          Cardiovascular hypertension, Rhythm:regular Rate:Normal     Neuro/Psych    GI/Hepatic   Endo/Other  diabetes, Type 2, Oral Hypoglycemic Agents  Renal/GU      Musculoskeletal   Abdominal   Peds  Hematology   Anesthesia Other Findings SBO  Reproductive/Obstetrics                          Anesthesia Physical Anesthesia Plan  ASA: III  Anesthesia Plan: General   Post-op Pain Management:    Induction: Intravenous  Airway Management Planned: Oral ETT  Additional Equipment:   Intra-op Plan:   Post-operative Plan: Possible Post-op intubation/ventilation  Informed Consent: I have reviewed the patients History and Physical, chart, labs and discussed the procedure including the risks, benefits and alternatives for the proposed anesthesia with the patient or authorized representative who has indicated his/her understanding and acceptance.     Plan Discussed with: CRNA, Anesthesiologist and Surgeon  Anesthesia Plan Comments:         Anesthesia Quick Evaluation

## 2012-11-12 NOTE — Progress Notes (Signed)
Patient ID: Dawn [name], female   DOB: [birth year], 77 y.o.   MRN: [id number]    Subjective: Pain is the same.  No BM or flatus  Objective: Vital signs in last 24 hours: Temp:  [99 F (37.2 C)] 99 F (37.2 C) (12/21 0600) Pulse Rate:  [54-102] 102  (12/21 0600) Resp:  [16-20] 16  (12/21 0600) BP: (96-132)/(54-98) 112/67 mmHg (12/21 0600) SpO2:  [96 %-97 %] 97 % (12/21 0600) Last BM Date: 11/09/12  Intake/Output from previous day: 12/20 0701 - 12/21 0700 In: 850 [I.V.:600; NG/GT:250] Out: 1100 [Urine:1100] Intake/Output this shift:    General appearance: alert, cooperative and mild distress GI: abnormal findings:  moderate tenderness in the entire abdomen and distended and tympanitic  Lab Results:   [facility] 11/11/12 0620 11/10/12 1802  WBC 17.1* 18.6*  HGB 11.3* 11.9*  HCT 34.4* 37.4  PLT 498* 511*   BMET  Basename 11/11/12 0620 11/10/12 1802  NA 128* 131*  K 5.0 5.0  CL 90* 87*  CO2 26 26  GLUCOSE 109* 75  BUN 49* 44*  CREATININE 1.93* 2.05*  CALCIUM 9.3 10.5     Studies/Results: Ct Abdomen Pelvis Wo Contrast  11/10/2012  *RADIOLOGY REPORT*  Clinical Data: Abdominal pain  CT ABDOMEN AND PELVIS WITHOUT CONTRAST  Technique:  Multidetector CT imaging of the abdomen and pelvis was performed following the standard protocol without intravenous contrast.  Comparison: 11/10/2012 radiograph  Findings: Linear opacities at the lung bases, most in keeping with atelectasis or scarring.  Heart size within normal limits. Coronary artery calcification. Small left Bochdalek hernia.  No pleural or pericardial effusion.  Organ abnormality/lesion detection is limited in the absence of intravenous contrast. Within this limitation, unremarkable liver, spleen, pancreas, adrenal glands, kidneys.  No hydronephrosis or hydroureter.  Colonic diverticulosis.  The colon is relatively decompressed as are the distal small bowel loops.  Proximal small bowel loops are dilated with air-fluid  levels, measuring up to 4.9 cm.  Transition point within the midline pelvis as seen on series 2 image 69, with a fecalized small bowel loop. There is a small amount of adjacent mesenteric fluid or stranding as seen on image 62.  Nonspecific thickening along the anti dependent gastric fundal wall as seen on coronal image 66  There is a small amount of perihepatic fluid that measures water density and a small amount of fluid within the right paracolic gutter and pelvis, measuring slightly higher than that of simple fluid. Left pericolic gutter fluid is nonspecific as the has a mildly nodular appearance.  No free intraperitoneal air identified.  No lymphadenopathy.  There is scattered atherosclerotic calcification of the aorta and its branches. No aneurysmal dilatation.  Thin-walled bladder.  Nonspecific uterine calcifications.  No adnexal mass.  Bilateral SI joint degenerative changes.  Multilevel degenerative changes and osteopenia.  IMPRESSION: Small bowel obstruction with transition in the pelvis.  There is a small amount of free intraperitoneal fluid which is nonspecific and may be reactive.  Recommend surgical consultation.  Left paracolic gutter stranding / fluid may also be secondary to the above process, however, has a mildly nodular configuration. Recommend attention on follow-up.  Intraluminal soft tissue attenuation along the gastric fundus may be ingested contents.  EGD follow-up can be obtained to exclude a mucosal lesion.   Original Report Authenticated By: Jearld Lesch, M.D.    Dg Abd Acute W/chest  11/10/2012  *RADIOLOGY REPORT*  Clinical Data: Abdominal pain and weakness  ACUTE ABDOMEN SERIES (ABDOMEN 2 VIEW &  CHEST 1 VIEW)  Comparison: Chest 01/20/2005  Findings: Small bowel dilatation with air-fluid levels compatible with small bowel obstruction.  Negative for free air.  Colon is decompressed with a small amount of gas in the rectum.  Left lower lobe density is present which is most likely  due to a Bochdalek hernia as seen on prior lateral chest x-rays.  There is mild left lower lobe atelectasis.  Right lung is clear.  Negative for heart failure.  IMPRESSION: Left lower lobe density due to atelectasis and the posterior diaphragmatic hernia.  Small bowel obstruction   Original Report Authenticated By: Janeece Riggers, M.D.     Anti-infectives: Anti-infectives    None      Assessment/Plan: SBO, persistent.  I am repeating xrays stat but I do not think she is progressing and have reccommended proceeding to OR today for exploratory laparotomy. Explained need for procedure and risks of anesthesia, bleeding, infection bowel injury and others.  She understands and agrees to proceed    LOS: 2 days    Somalia Segler T 11/12/2012

## 2012-11-12 NOTE — Preoperative (Addendum)
Beta Blockers   Reason not to administer Beta Blockers:

## 2012-11-12 NOTE — Op Note (Signed)
Preoperative Diagnosis: Small bowell obstruction  Postoprative Diagnosis: Same secondary to intraluminal mass or possibly bezoar  Procedure: Procedure(s): EXPLORATORY LAPAROTOMY SMALL BOWEL RESECTION   Surgeon: Glenna Fellows T   Assistants: Chevis Pretty  Anesthesia:  General endotracheal anesthesiaDiagnos  Indications:   Patient is a 77 year old female with no previous history of abdominal surgery or GI problems who presents with small bowel obstruction confirmed on CT scan. This has failed to improve with 36 hours of observation with ongoing tenderness and I recommended proceeding with emergency laparotomy. Indications for the procedure and risks have been discussed in detail does wear  Procedure Detail:  Patient was brought to the operating room, placed in the supine position on the operating table, and general endotracheal anesthesia induced. She received broad-spectrum preoperative IV antibiotics. PAS were in place. The abdomen was widely sterilely prepped and draped. Patient timeout was performed and correct procedure verified. The obstruction on CT appeared to be in the pelvis and then made a low midline incision and dissect was carried down through the subcutaneous tissue and midline fascia using cautery the peritoneum entered under direct vision. There was clear serous peritoneal fluid. There were multiple dilated loops of small bowel. These were traced down distally and in the mid pelvis there was an abrupt transition from dilated bowel to normal caliber and there was some exudate on the wall of the bowel at this point and a few inflammatory adhesions between loops. These were broken up and the bowel delivered out of the abdominal cavity. At the point of obstruction there was a palpable intraluminal mass that was completely filling the lumen of the small bowel. I did not seem to be able to milk it proximally or distally. For about 20-25 cm proximal to this there was some exudate on the  bowel wall it appeared somewhat inflamed and then transitioned to dilated but otherwise normal appearing intestine proximal to this. I followed the bowel back proximal to the ligament of Treitz. There were noted to be several large mesenteric jejunal diverticula but were noninflamed. No other abnormalities were seen. The bowel distal the obstruction was completely normal. The cecum and appendix and right colon were normal. Uterus and ovaries were unremarkable. The sigmoid colon showed diverticulosis but was otherwise unremarkable. I elected to resect the area of the small bowel with the mass including the inflamed-appearing proximal bowel as well. Points of proximal and distal resection were chosen and cleaned of mesentery and divided with the GIA 75 mm stapler. The mesentery was sequentially divided with the LigaSure were in the specimen removed. On a separate table I opened the specimen and there was a necrotic appearing mass within the small bowel that was not attached to the mucosa appeared completely free. It was very hard and tough and appeared to be some sort of tissue. This possibly could have been a bezoar. The small bowel and this were sent for permanent pathology. Following this a functional end-to-end anastomosis was created with a single firing of the GIA 75 mm stapler and the common enterotomy was closed in 2 layers with running 3-0 chromic and seromuscular interrupted 3-0 silk. The mesentery was closed with interrupted 3-0 silk. The anastomosis appeared widely patent and no tension. There was no bleeding. It was airtight on pressure testing. The viscera returned to their anatomic position and the abdomen was thoroughly irrigated with saline. A wound protector which had been placed before the anastomosis was removed and gloves and instruments changed. The midline fascia was closed with  running looped #1 PDS begun the uterine incision and tied centrally. Subcutaneous tissue was irrigated and the skin  closed with staples. Sponge needle and instrument counts were correct.   Estimated Blood Loss:  Minimal         Drains: none  Blood Given: none          Specimens: portion of small intestine an intraluminal mass        Complications:  * No complications entered in OR log *         Disposition: PACU - hemodynamically stable.         Condition: stable  Mariella Saa MD, FACS  11/12/2012, 2:30 PM

## 2012-11-12 NOTE — Anesthesia Procedure Notes (Signed)
Procedure Name: Intubation Date/Time: 11/12/2012 12:47 PM Performed by: Alanda Amass A Pre-anesthesia Checklist: Patient identified, Timeout performed, Emergency Drugs available, Suction available and Patient being monitored Patient Re-evaluated:Patient Re-evaluated prior to inductionOxygen Delivery Method: Circle system utilized Preoxygenation: Pre-oxygenation with 100% oxygen Intubation Type: IV induction, Rapid sequence and Cricoid Pressure applied Laryngoscope Size: Mac and 3 Grade View: Grade I Tube type: Oral Tube size: 8.0 mm Number of attempts: 1 Airway Equipment and Method: Stylet Placement Confirmation: ETT inserted through vocal cords under direct vision,  breath sounds checked- equal and bilateral and positive ETCO2 Secured at: 21 cm Tube secured with: Tape Dental Injury: Teeth and Oropharynx as per pre-operative assessment

## 2012-11-12 NOTE — Anesthesia Postprocedure Evaluation (Signed)
  Anesthesia Post-op Note  Patient: Dawn [name]  Procedure(s) Performed: Procedure(s) (LRB) with comments: EXPLORATORY LAPAROTOMY (N/A) SMALL BOWEL RESECTION (N/A)  Patient Location: PACU  Anesthesia Type:General  Level of Consciousness: awake, oriented, sedated and patient cooperative  Airway and Oxygen Therapy: Patient Spontanous Breathing  Post-op Pain: mild  Post-op Assessment: Post-op Vital signs reviewed, Patient's Cardiovascular Status Stable, Respiratory Function Stable, Patent Airway, No signs of Nausea or vomiting and Pain level controlled  Post-op Vital Signs: stable  Complications: No apparent anesthesia complications

## 2012-11-12 NOTE — Addendum Note (Signed)
Addendum  created 11/12/12 1512 by Atilano Ina, CRNA   Modules edited:Anesthesia Events

## 2012-11-13 LAB — BASIC METABOLIC PANEL
CO2: 22 mEq/L (ref 19–32)
Chloride: 101 mEq/L (ref 96–112)
Creatinine, Ser: 1.19 mg/dL — ABNORMAL HIGH (ref 0.50–1.10)
Potassium: 4.8 mEq/L (ref 3.5–5.1)
Sodium: 132 mEq/L — ABNORMAL LOW (ref 135–145)

## 2012-11-13 LAB — CBC WITH DIFFERENTIAL/PLATELET
Basophils Absolute: 0 10*3/uL (ref 0.0–0.1)
HCT: 31.1 % — ABNORMAL LOW (ref 36.0–46.0)
Lymphocytes Relative: 6 % — ABNORMAL LOW (ref 12–46)
Monocytes Absolute: 0.8 10*3/uL (ref 0.1–1.0)
Neutro Abs: 11.1 10*3/uL — ABNORMAL HIGH (ref 1.7–7.7)
RDW: 13.4 % (ref 11.5–15.5)
WBC: 12.6 10*3/uL — ABNORMAL HIGH (ref 4.0–10.5)

## 2012-11-13 LAB — GLUCOSE, CAPILLARY
Glucose-Capillary: 114 mg/dL — ABNORMAL HIGH (ref 70–99)
Glucose-Capillary: 145 mg/dL — ABNORMAL HIGH (ref 70–99)
Glucose-Capillary: 86 mg/dL (ref 70–99)
Glucose-Capillary: 87 mg/dL (ref 70–99)
Glucose-Capillary: 92 mg/dL (ref 70–99)

## 2012-11-13 MED ORDER — MUPIROCIN 2 % EX OINT
TOPICAL_OINTMENT | Freq: Two times a day (BID) | CUTANEOUS | Status: DC
  Administered 2012-11-13 – 2012-11-18 (×12): via NASAL
  Filled 2012-11-13: qty 22

## 2012-11-13 NOTE — Progress Notes (Signed)
Patient ID: Dawn [name], female   DOB: 05/10/1935, 77 y.o.   MRN: [id number] 1 Day Post-Op  Subjective: "Doing fine"  Denies pain or other C/O  Objective: Vital signs in last 24 hours: Temp:  [97.4 F (36.3 C)-99.5 F (37.5 C)] 98.9 F (37.2 C) (12/22 0549) Pulse Rate:  [78-109] 104  (12/22 0549) Resp:  [16-25] 16  (12/22 0549) BP: (82-114)/(40-55) 114/55 mmHg (12/22 0549) SpO2:  [80 %-100 %] 97 % (12/22 0549) Last BM Date: 11/09/12  Intake/Output from previous day: 12/21 0701 - 12/22 0700 In: 4430 [I.V.:3300; NG/GT:870; IV Piggyback:260] Out: 2200 [Urine:750; Emesis/NG output:150; Blood:100] Intake/Output this shift:    General appearance: alert, cooperative and no distress Resp: clear to auscultation bilaterally GI: Soft with minimal incisional tenderness.  Bilious/feculent NG drainage Incision/Wound: Dressing clean and dry  Lab Results:   Basename 11/13/12 0530 11/12/12 0925  WBC 12.6* 15.9*  HGB 10.2* 11.6*  HCT 31.1* 35.3*  PLT 436* 472*   BMET  Basename 11/13/12 0530 11/12/12 0925  NA 132* 128*  K 4.8 5.0  CL 101 91*  CO2 22 25  GLUCOSE 103* 145*  BUN 26* 35*  CREATININE 1.19* 1.21*  CALCIUM 7.8* 8.8     Studies/Results: Dg Abd Portable 2v  11/12/2012  *RADIOLOGY REPORT*  Clinical Data: Follow up small bowel obstruction.  PORTABLE ABDOMEN - 2 VIEW  Comparison: CT abdomen and pelvis 11/10/2012.  Findings: Persisting gaseous distention of multiple loops of small bowel throughout the abdomen and pelvis, with air-fluid levels on the lateral decubitus image.  Gas and stool in normal caliber colon.  Appearance not significantly changed since the prior CT. Nasogastric tube tip at the esophagogastric junction.  IMPRESSION:  1.  No significant change in the partial small bowel obstruction. No free intraperitoneal air. 2.  Nasogastric tube tip at the esophagogastric junction.  This should be advanced several centimeters.  These results were called by telephone  on 11/12/2012 at 1028 hours to [facility], the nurse caring for the patient on [address] [place], who verbally acknowledged these results.   Original Report Authenticated By: Hulan Saas, M.D.     Anti-infectives: Anti-infectives     Start     Dose/Rate Route Frequency Ordered Stop   11/12/12 1030   metroNIDAZOLE (FLAGYL) IVPB 500 mg        500 mg 100 mL/hr over 60 Minutes Intravenous  Once 11/12/12 0932 11/12/12 1130          Assessment/Plan: s/p Procedure(s): EXPLORATORY LAPAROTOMY SMALL BOWEL RESECTION Stable post op D/C Foley Has been OOB Check lab in AM   LOS: 3 days    Jawaun Celmer T 11/13/2012

## 2012-11-14 ENCOUNTER — Encounter (HOSPITAL_COMMUNITY): Payer: Self-pay | Admitting: General Surgery

## 2012-11-14 DIAGNOSIS — K56609 Unspecified intestinal obstruction, unspecified as to partial versus complete obstruction: Secondary | ICD-10-CM | POA: Diagnosis present

## 2012-11-14 LAB — CBC WITH DIFFERENTIAL/PLATELET
Basophils Relative: 0 % (ref 0–1)
Eosinophils Absolute: 0.1 10*3/uL (ref 0.0–0.7)
Eosinophils Relative: 1 % (ref 0–5)
Lymphs Abs: 0.9 10*3/uL (ref 0.7–4.0)
MCH: 29.5 pg (ref 26.0–34.0)
MCHC: 33.1 g/dL (ref 30.0–36.0)
MCV: 89.1 fL (ref 78.0–100.0)
Neutrophils Relative %: 85 % — ABNORMAL HIGH (ref 43–77)
Platelets: 411 10*3/uL — ABNORMAL HIGH (ref 150–400)

## 2012-11-14 LAB — GLUCOSE, CAPILLARY
Glucose-Capillary: 120 mg/dL — ABNORMAL HIGH (ref 70–99)
Glucose-Capillary: 68 mg/dL — ABNORMAL LOW (ref 70–99)
Glucose-Capillary: 70 mg/dL (ref 70–99)
Glucose-Capillary: 73 mg/dL (ref 70–99)
Glucose-Capillary: 75 mg/dL (ref 70–99)

## 2012-11-14 LAB — BASIC METABOLIC PANEL
BUN: 17 mg/dL (ref 6–23)
Calcium: 8.3 mg/dL — ABNORMAL LOW (ref 8.4–10.5)
GFR calc Af Amer: 63 mL/min — ABNORMAL LOW (ref >90)
GFR calc non Af Amer: 54 mL/min — ABNORMAL LOW (ref >90)
Glucose, Bld: 104 mg/dL — ABNORMAL HIGH (ref 70–99)
Potassium: 4.8 mEq/L (ref 3.5–5.1)
Sodium: 136 mEq/L (ref 135–145)

## 2012-11-14 MED ORDER — DEXTROSE 50 % IV SOLN
INTRAVENOUS | Status: AC
  Administered 2012-11-14: 50 mL
  Filled 2012-11-14: qty 50

## 2012-11-14 MED ORDER — DEXTROSE 50 % IV SOLN
25.0000 mL | Freq: Once | INTRAVENOUS | Status: AC | PRN
  Administered 2012-11-14: 25 mL via INTRAVENOUS

## 2012-11-14 MED ORDER — DEXTROSE 50 % IV SOLN
25.0000 mL | Freq: Once | INTRAVENOUS | Status: AC | PRN
  Filled 2012-11-14: qty 50

## 2012-11-14 NOTE — Progress Notes (Signed)
Utilization review completed. Xhaiden Coombs, RN, BSN. 

## 2012-11-14 NOTE — Progress Notes (Signed)
Hypoglycemic Event  CBG: 68  Treatment: 25mL 50% Dextrose  Symptoms: None  Follow-up CBG: Time:0450 CBG Result: 119  Possible Reasons for Event: NPO  Comments/MD notified: Patient stated she felt fine    Dawn [name], [name]  Remember to initiate Hypoglycemia Order Set & complete

## 2012-11-14 NOTE — Progress Notes (Signed)
Patient ID: Dawn [name], female   DOB: 06-Aug-1935, 77 y.o.   MRN: [id number] 2 Days Post-Op  Subjective: Patient states that she feels good, offers no c/o.  Objective: Vital signs in last 24 hours: Temp:  [98.6 F (37 C)-99.1 F (37.3 C)] 98.6 F (37 C) (12/23 0651) Pulse Rate:  [92-99] 92  (12/23 0651) Resp:  [16-18] 18  (12/23 0651) BP: (101-114)/(46-89) 114/50 mmHg (12/23 0651) SpO2:  [94 %-99 %] 99 % (12/23 0651) Last BM Date: 11/09/12  Intake/Output from previous day: 12/22 0701 - 12/23 0700 In: 2416.7 [I.V.:2416.7] Out: 700 [Urine:600; Emesis/NG output:100] Intake/Output this shift:    General appearance: alert, cooperative and no distress Resp: clear to auscultation bilaterally GI: Soft with minimal incisional tenderness.  Bilious/feculent NG drainage Incision/Wound: Dressing clean and dry, staples intact. No BS or flatus/BM. Labs: WBC improving VSS, afebrile  Lab Results:   Basename 11/14/12 0535 11/13/12 0530  WBC 12.1* 12.6*  HGB 8.9* 10.2*  HCT 26.9* 31.1*  PLT 411* 436*   BMET  Basename 11/14/12 0535 11/13/12 0530  NA 136 132*  K 4.8 4.8  CL 105 101  CO2 23 22  GLUCOSE 104* 103*  BUN 17 26*  CREATININE 0.99 1.19*  CALCIUM 8.3* 7.8*     Studies/Results: Dg Abd Portable 2v  11/12/2012  *RADIOLOGY REPORT*  Clinical Data: Follow up small bowel obstruction.  PORTABLE ABDOMEN - 2 VIEW  Comparison: CT abdomen and pelvis 11/10/2012.  Findings: Persisting gaseous distention of multiple loops of small bowel throughout the abdomen and pelvis, with air-fluid levels on the lateral decubitus image.  Gas and stool in normal caliber colon.  Appearance not significantly changed since the prior CT. Nasogastric tube tip at the esophagogastric junction.  IMPRESSION:  1.  No significant change in the partial small bowel obstruction. No free intraperitoneal air. 2.  Nasogastric tube tip at the esophagogastric junction.  This should be advanced several centimeters.   These results were called by telephone on 11/12/2012 at 1028 hours to [facility], the nurse caring for the patient on 5 [place], who verbally acknowledged these results.   Original Report Authenticated By: Hulan Saas, M.D.     Anti-infectives: Anti-infectives     Start     Dose/Rate Route Frequency Ordered Stop   11/12/12 1030   metroNIDAZOLE (FLAGYL) IVPB 500 mg        500 mg 100 mL/hr over 60 Minutes Intravenous  Once 11/12/12 0932 11/12/12 1130          Assessment/Plan: There is no problem list on file for this patient. s/p Procedure(s): EXPLORATORY LAPAROTOMY SMALL BOWEL RESECTION Stable post op Continue with NG IVF Follow clinical picture/labs OOB/ambulate    LOS: 4 days    Blenda Mounts ACNP [facility] Surgery Pager # [phone] 11/14/2012

## 2012-11-14 NOTE — Progress Notes (Signed)
Agree with A&P of JD,PA. She is comfortable, but will be another day or two with NG. Path still pending

## 2012-11-15 LAB — GLUCOSE, CAPILLARY
Glucose-Capillary: 124 mg/dL — ABNORMAL HIGH (ref 70–99)
Glucose-Capillary: 69 mg/dL — ABNORMAL LOW (ref 70–99)
Glucose-Capillary: 76 mg/dL (ref 70–99)

## 2012-11-15 MED ORDER — DEXTROSE 50 % IV SOLN
25.0000 mL | Freq: Once | INTRAVENOUS | Status: AC | PRN
  Administered 2012-11-15: 25 mL via INTRAVENOUS

## 2012-11-15 MED ORDER — PHENOL 1.4 % MT LIQD: 1.0000 | OROMUCOSAL | Status: DC | PRN

## 2012-11-15 MED ORDER — DEXTROSE 50 % IV SOLN
INTRAVENOUS | Status: AC
  Filled 2012-11-15: qty 50

## 2012-11-15 NOTE — Progress Notes (Signed)
Patient ID: Dawn [name], female   DOB: 1935-05-03, 77 y.o.   MRN: [id number] 3 Days Post-Op   Subjective: Patient states that she feels good, offers no c/o.  Wants NG tube out.  Objective: Vital signs in last 24 hours: Temp:  [98.7 F (37.1 C)-99 F (37.2 C)] 98.9 F (37.2 C) (12/24 0502) Pulse Rate:  [92-98] 98  (12/24 0502) Resp:  [18] 18  (12/24 0502) BP: (133-139)/(67-68) 139/67 mmHg (12/24 0502) SpO2:  [96 %-100 %] 99 % (12/24 0502) Last BM Date: 11/09/12  Intake/Output from previous day:   Intake/Output this shift:    General appearance: alert, cooperative and no distress Resp: clear to auscultation bilaterally GI: Soft with minimal incisional tenderness.  Bilious NG drainage ( ) Incision/Wound: Dressing clean and dry, staples intact. No BS or flatus/BM. VSS, afebrile  Lab Results:   Basename 11/14/12 0535 11/13/12 0530  WBC 12.1* 12.6*  HGB 8.9* 10.2*  HCT 26.9* 31.1*  PLT 411* 436*   BMET  Basename 11/14/12 0535 11/13/12 0530  NA 136 132*  K 4.8 4.8  CL 105 101  CO2 23 22  GLUCOSE 104* 103*  BUN 17 26*  CREATININE 0.99 1.19*  CALCIUM 8.3* 7.8*     Studies/Results: No results found.  Anti-infectives: Anti-infectives     Start     Dose/Rate Route Frequency Ordered Stop   11/12/12 1030   metroNIDAZOLE (FLAGYL) IVPB 500 mg        500 mg 100 mL/hr over 60 Minutes Intravenous  Once 11/12/12 0932 11/12/12 1130          Assessment/Plan: Patient Active Problem List  Diagnosis  . SBO (small bowel obstruction)  s/p Procedure(s): EXPLORATORY LAPAROTOMY SMALL BOWEL RESECTION Stable post op Continue with NG (will try clamping) IVF Follow clinical picture/labs OOB/ambulate    LOS: 5 days    Blenda Mounts ACNP [facility] Pager # [phone] 11/15/2012

## 2012-11-15 NOTE — Progress Notes (Signed)
Pt with flatus.  NGT clamped. Hopefully can remove this PM or in am.

## 2012-11-15 NOTE — Progress Notes (Signed)
11/15/12 1300  °Clinical Encounter Type  °Visited With Patient  °Visit Type Initial;Spiritual support  ° Provided emotional and spiritual support for the patient. °Chaplain, Linda Draper °

## 2012-11-15 NOTE — Progress Notes (Signed)
Hypoglycemic Event  CBG: 67  Treatment: D50 IV 25 mL pushed at 1635  Symptoms: None  Follow-up CBG: Time:1650 CBG Result: 124  Possible Reasons for Event: Other: NPO, inadequate meal intake  Comments/MD notified: followed hypoglycemic protocol     Dawn [name] L  Remember to initiate Hypoglycemia Order Set & complete

## 2012-11-16 LAB — GLUCOSE, CAPILLARY
Glucose-Capillary: 84 mg/dL (ref 70–99)
Glucose-Capillary: 59 mg/dL — ABNORMAL LOW (ref 70–99)
Glucose-Capillary: 138 mg/dL — ABNORMAL HIGH (ref 70–99)
Glucose-Capillary: 92 mg/dL (ref 70–99)

## 2012-11-16 MED ORDER — INSULIN ASPART 100 UNIT/ML ~~LOC~~ SOLN: 0.0000 [IU] | Freq: Three times a day (TID) | SUBCUTANEOUS | Status: DC

## 2012-11-16 MED ORDER — DEXTROSE 50 % IV SOLN
25.0000 mL | Freq: Once | INTRAVENOUS | Status: AC | PRN
  Administered 2012-11-16: 25 mL via INTRAVENOUS

## 2012-11-16 MED ORDER — INSULIN ASPART 100 UNIT/ML ~~LOC~~ SOLN
0.0000 [IU] | Freq: Three times a day (TID) | SUBCUTANEOUS | Status: DC
  Administered 2012-11-17: 1 [IU] via SUBCUTANEOUS

## 2012-11-16 MED ORDER — BISACODYL 10 MG RE SUPP
10.0000 mg | Freq: Once | RECTAL | Status: AC
  Administered 2012-11-16: 10 mg via RECTAL
  Filled 2012-11-16: qty 1

## 2012-11-16 MED ORDER — INSULIN ASPART 100 UNIT/ML ~~LOC~~ SOLN: 0.0000 [IU] | Freq: Every day | SUBCUTANEOUS | Status: DC

## 2012-11-16 MED ORDER — PANTOPRAZOLE SODIUM 40 MG PO TBEC
40.0000 mg | DELAYED_RELEASE_TABLET | Freq: Every day | ORAL | Status: DC
  Administered 2012-11-16 – 2012-11-18 (×3): 40 mg via ORAL
  Filled 2012-11-16 (×2): qty 1

## 2012-11-16 NOTE — Progress Notes (Signed)
4 Days Post-Op   Assessment: s/p Procedure(s): EXPLORATORY LAPAROTOMY SMALL BOWEL RESECTION Patient Active Problem List  Diagnosis  . SBO (small bowel obstruction)    Slowly improving s/p sb resection for sbo  Plan: Will d/c NG allow sips, and give dulcolax supp today  Subjective: Feels OK, wants po's tolerated NG clamped, not passed gas or had a BM, but feels "grumbling"  Objective: Vital signs in last 24 hours: Temp:  [98.6 F (37 C)-98.8 F (37.1 C)] 98.8 F (37.1 C) (12/24 2352) Pulse Rate:  [91-100] 91  (12/24 2352) Resp:  [18-22] 22  (12/24 2352) BP: (143-147)/(75-85) 143/85 mmHg (12/24 2352) SpO2:  [96 %-98 %] 96 % (12/24 2352)   Intake/Output from previous day: 12/24 0701 - 12/25 0700 In: 3800 [I.V.:3800] Out: -  Intake/Output this shift:     General appearance: alert, cooperative and no distress Resp: clear to auscultation bilaterally Cardio: regular rate and rhythm, S1, S2 normal, no murmur, click, rub or gallop GI: Soft, not particularly tender, occ BS  Incision: healing well  Lab Results:   Basename 11/14/12 0535  WBC 12.1*  HGB 8.9*  HCT 26.9*  PLT 411*   BMET  Basename 11/14/12 0535  NA 136  K 4.8  CL 105  CO2 23  GLUCOSE 104*  BUN 17  CREATININE 0.99  CALCIUM 8.3*   PT/INR No results found for this basename: LABPROT:2,INR:2 in the last 72 hours ABG No results found for this basename: PHART:2,PCO2:2,PO2:2,HCO3:2 in the last 72 hours  MEDS, Scheduled    . heparin  5,000 Units Subcutaneous Q8H  . insulin aspart  0-9 Units Subcutaneous Q4H  . mupirocin ointment   Nasal BID  . pantoprazole (PROTONIX) IV  40 mg Intravenous QHS  . sodium chloride  1,000 mL Intravenous Once    Studies/Results: No results found.    LOS: 6 days     Currie Paris, MD, [facility], [place] [phone]   11/16/2012 7:55 AM

## 2012-11-17 LAB — CBC
Hemoglobin: 10.5 g/dL — ABNORMAL LOW (ref 12.0–15.0)
MCHC: 32.6 g/dL (ref 30.0–36.0)
RBC: 3.67 MIL/uL — ABNORMAL LOW (ref 3.87–5.11)
RDW: 13.5 % (ref 11.5–15.5)
WBC: 7.1 10*3/uL (ref 4.0–10.5)

## 2012-11-17 LAB — GLUCOSE, CAPILLARY

## 2012-11-17 MED ORDER — ACETAMINOPHEN 325 MG PO TABS
650.0000 mg | ORAL_TABLET | Freq: Four times a day (QID) | ORAL | Status: DC | PRN
  Administered 2012-11-18: 650 mg via ORAL
  Filled 2012-11-17: qty 2

## 2012-11-17 NOTE — Progress Notes (Signed)
Utilization review completed. Jsean Taussig, RN, BSN. 

## 2012-11-17 NOTE — Progress Notes (Signed)
Patient ID: Dawn [name], female   DOB: 1935/06/19, 77 y.o.   MRN: [id number] 5 Days Post-Op   Subjective: Patient states that she feels good, offers no c/o. Would like something different to eat; has been on clears and tol well + BM last pm. + BS abdomen is soft, Non distended or tender.   Objective: Vital signs in last 24 hours: Temp:  [98.2 F (36.8 C)-99.3 F (37.4 C)] 98.5 F (36.9 C) (12/26 0531) Pulse Rate:  [88-104] 88  (12/26 0531) Resp:  [16-18] 16  (12/26 0531) BP: (123-125)/(64-69) 123/65 mmHg (12/26 0531) SpO2:  [95 %-98 %] 95 % (12/26 0531) Last BM Date: 11/16/12  Intake/Output from previous day: 12/25 0701 - 12/26 0700 In: 470 [P.O.:470] Out: -  Intake/Output this shift:    General appearance: alert, cooperative and no distress Resp: clear to auscultation bilaterally GI: Soft no tenderness. Incision/Wound: Dressing clean and dry, staples intact.  +BS, flatus/BM. VSS, afebrile  Lab Results:  No results found for this basename: WBC:2,HGB:2,HCT:2,PLT:2 in the last 72 hours BMET No results found for this basename: NA:2,K:2,CL:2,CO2:2,GLUCOSE:2,BUN:2,CREATININE:2,CALCIUM:2 in the last 72 hours   Studies/Results: No results found.  Anti-infectives: Anti-infectives     Start     Dose/Rate Route Frequency Ordered Stop   11/12/12 1030   metroNIDAZOLE (FLAGYL) IVPB 500 mg        500 mg 100 mL/hr over 60 Minutes Intravenous  Once 11/12/12 0932 11/12/12 1130          Assessment/Plan: Patient Active Problem List  Diagnosis  . SBO (small bowel obstruction)  s/p Procedure(s): EXPLORATORY LAPAROTOMY SMALL BOWEL RESECTION Stable post op Advance diet to fulls Follow clinical picture/labs OOB/ambulate ? Home soon    LOS: 7 days    Golda Acre [facility] Pager # [phone] 11/17/2012

## 2012-11-17 NOTE — Progress Notes (Signed)
Agree with A&P of JD,NP. Improving daily hopefully able to go home in a day or two

## 2012-11-18 LAB — GLUCOSE, CAPILLARY: Glucose-Capillary: 114 mg/dL — ABNORMAL HIGH (ref 70–99)

## 2012-11-18 MED ORDER — ACETAMINOPHEN 325 MG PO TABS: 650.0000 mg | ORAL_TABLET | Freq: Four times a day (QID) | ORAL | Status: DC | PRN

## 2012-11-18 NOTE — Discharge Summary (Signed)
Physician Discharge Summary  Patient ID: Dawn [name] MRN: [id number] DOB/AGE: [birth year] 77 y.o.  Admit date: 11/10/2012 Discharge date: 11/18/2012  Admission Diagnoses: Abdominal pain and distention.  Discharge Diagnoses: Small bowell obstruction  Postoprative Diagnosis: Same secondary to intraluminal mass or possibly bezoar Exploratory lap and small bowel resection.   Discharged Condition: Stable  Hospital Course:Patient has a three-week history of crampy abdominal pain. It was intermittent. For the past few days, it has increased in frequency. She has been passing gas. She has not had a bowel movement for a couple days. She noticed her abdomen become distended. She went to urgent care and was referred to the [facility] emergency department. Plain films show small bowel obstruction I was asked to see her in the emergency department. She denies having surgery in the past.  Patient had exploratory lap and small bowel resection on 11/12/12 by Dr. Adriana Mccallum. Post op she has progressed well, tolerated a diet, remained afebrile and hemodynamically stable. Pain has been well managed w/o narcotics. At this time she is stable for discharge to home self care; all post op instructions were reviewed with the patient hand she has verbalized understanding of same. She will follow up with Dr. Adriana Mccallum in approximately 2 weeks time for post op check and review of pathology report. (Not available at time of discharge).   Consults: Nutrition and Spiritual Care  Significant Diagnostic Studies: labs, microbiology,radiology, and pathology.  Treatments: IV hydration, antibiotics,analgesia, respiratory therapy,and surgery.  Discharge Exam: Blood pressure 119/62, pulse 99, temperature 98.2 F (36.8 C), temperature source Oral, resp. rate 16, height 5' 5.5" (1.664 m), weight 175 lb (79.379 kg), SpO2 96.00%.  General appearance: alert, cooperative and no distress  Resp: clear to auscultation  bilaterally  GI: Soft no tenderness.  Incision/Wound:  Dressing clean and dry, staples intact. +BS, flatus/BM.  VSS, afebrile   Disposition:  Home/self care  Follow up with Dr. Adriana Mccallum in approx 2 weeks time.  Discharge Orders    Future Orders Please Complete By Expires   Discharge patient      Comments:   To home self care.  Patient is to schedule a follow up appointment with Dr. Adriana Mccallum in 2 weeks time.       Medication List     As of 11/18/2012  9:39 AM    TAKE these medications         acetaminophen 325 MG tablet   Commonly known as: TYLENOL   Take 2 tablets (650 mg total) by mouth every 6 (six) hours as needed.      albuterol 108 (90 BASE) MCG/ACT inhaler   Commonly known as: PROVENTIL HFA;VENTOLIN HFA   Inhale 2 puffs into the lungs every 6 (six) hours as needed.      glipiZIDE 10 MG tablet   Commonly known as: GLUCOTROL   Take 10 mg by mouth daily with breakfast.      metFORMIN 500 MG tablet   Commonly known as: GLUCOPHAGE   Take 500 mg by mouth daily after supper.           Follow-up Information    Follow up with HOXWORTH,BENJAMIN T, MD. In 2 weeks. (.)    Contact information:   [address] [place] [address] [phone]          Signed: Blenda Mounts 11/18/2012, 9:39 AM

## 2012-11-18 NOTE — Discharge Summary (Signed)
Agree with A&P of JD,NP. Patient is anxious to head home and understands plans for post op follow up

## 2012-12-01 ENCOUNTER — Telehealth (INDEPENDENT_AMBULATORY_CARE_PROVIDER_SITE_OTHER): Payer: Self-pay

## 2012-12-01 NOTE — Telephone Encounter (Signed)
The pt called today because she was supposed to follow up in 2 weeks.  She was expecting to get an appointment tomorrow.  She is doing well.  I told her Dr Johna Sheriff is booked but maybe she can wait until next week.  Please call to help her schedule her postop appointment.

## 2012-12-05 ENCOUNTER — Ambulatory Visit (INDEPENDENT_AMBULATORY_CARE_PROVIDER_SITE_OTHER): Payer: Medicare Other | Admitting: General Surgery

## 2012-12-05 ENCOUNTER — Encounter (INDEPENDENT_AMBULATORY_CARE_PROVIDER_SITE_OTHER): Payer: Self-pay | Admitting: General Surgery

## 2012-12-05 VITALS — BP 138/80 | HR 103 | Temp 96.6°F | Ht 65.5 in | Wt 169.8 lb

## 2012-12-05 DIAGNOSIS — Z4802 Encounter for removal of sutures: Secondary | ICD-10-CM

## 2012-12-05 NOTE — Progress Notes (Signed)
Patient comes in today s/p exp lap on 11/12/12 by Dr.Hoxworth for staple removal.patient's incision had zero redness, drainage, or infection and was healing very nicely...staples were removed intact and benzoin and 1/2 steri-strips were placed over incision. Patient tolerated very well and will keep follow up appt on 12/15/12 with Dr.Hoxworth.Marland Kitchen

## 2012-12-15 ENCOUNTER — Other Ambulatory Visit (INDEPENDENT_AMBULATORY_CARE_PROVIDER_SITE_OTHER): Payer: Self-pay

## 2012-12-15 ENCOUNTER — Encounter (INDEPENDENT_AMBULATORY_CARE_PROVIDER_SITE_OTHER): Payer: Self-pay | Admitting: General Surgery

## 2012-12-15 ENCOUNTER — Ambulatory Visit (INDEPENDENT_AMBULATORY_CARE_PROVIDER_SITE_OTHER): Payer: Medicare Other | Admitting: General Surgery

## 2012-12-15 VITALS — BP 134/78 | HR 84 | Temp 98.8°F | Resp 18 | Ht 65.5 in | Wt 169.0 lb

## 2012-12-15 DIAGNOSIS — Z09 Encounter for follow-up examination after completed treatment for conditions other than malignant neoplasm: Secondary | ICD-10-CM

## 2012-12-15 DIAGNOSIS — D214 Benign neoplasm of connective and other soft tissue of abdomen: Secondary | ICD-10-CM

## 2012-12-15 NOTE — Progress Notes (Signed)
History: Patient returns for her first postoperative visit to me now approximately one month following laparotomy for small bowel obstruction. At that time she was found to have a free-floating mass obstructing the ileum requiring small bowel resection. This appeared to be tissue but I was unsure if it was human tissue or bezoar. 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  This of course raises the question of an origin in the upper intestinal tract. She's feeling well at this point. Still a little fatigue in her appetite has not returned completely but she is eating without difficulty, no nausea or abdominal pain and bowel movements are okay.  Exam: BP 134/78  Pulse 84  Temp 98.8 F (37.1 C)  Resp 18  Ht 5' 5.5" (1.664 m)  Wt 169 lb (76.658 kg)  BMI 27.70 kg/m2 General: Appears well Abdomen: Soft and nontender. Wound well-healed.  Assessment and plan:status post laparotomy for small bowel resection. She had an apparent just to tumor that originated from her upper GI tract and migrated distally causing a bowel obstruction. CT scan did not show any other evidence of tumor. I think she should at least have an upper endoscopy to make sure there is not any residual mass that we can detect. We will ask for a GI referral in this regard. I will see her back in 6 weeks for more long-term followup.

## 2012-12-16 ENCOUNTER — Telehealth (INDEPENDENT_AMBULATORY_CARE_PROVIDER_SITE_OTHER): Payer: Self-pay

## 2012-12-16 NOTE — Telephone Encounter (Signed)
Referral made to [facility] for EGD.  Patient scheduled to see Dr. Evette Cristal on Tuesday 12/20/12 @ 1:45 pm.  Patient given referral appointment date, time and directions with phone number.

## 2013-01-16 ENCOUNTER — Telehealth (INDEPENDENT_AMBULATORY_CARE_PROVIDER_SITE_OTHER): Payer: Self-pay

## 2013-01-16 NOTE — Telephone Encounter (Signed)
Faxed office notes, op note, pathology and CT report to Dr. Evette Cristal @ [phone], fax confirmation rec'd.

## 2013-02-03 ENCOUNTER — Ambulatory Visit (INDEPENDENT_AMBULATORY_CARE_PROVIDER_SITE_OTHER): Payer: Medicare Other | Admitting: General Surgery

## 2013-02-03 ENCOUNTER — Encounter (INDEPENDENT_AMBULATORY_CARE_PROVIDER_SITE_OTHER): Payer: Self-pay | Admitting: General Surgery

## 2013-02-03 VITALS — BP 160/94 | HR 77 | Temp 98.1°F | Ht 63.5 in | Wt 173.2 lb

## 2013-02-03 DIAGNOSIS — Z09 Encounter for follow-up examination after completed treatment for conditions other than malignant neoplasm: Secondary | ICD-10-CM

## 2013-02-03 DIAGNOSIS — D214 Benign neoplasm of connective and other soft tissue of abdomen: Secondary | ICD-10-CM

## 2013-02-03 NOTE — Progress Notes (Signed)
History: Patient returns for her first postoperative visit to me now approximately one month following laparotomy for small bowel obstruction. At that time she was found to have a free-floating mass obstructing the ileum requiring small bowel resection. This appeared to be tissue but I was unsure if it was human tissue or bezoar. 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 This of course raises the question of an origin in the upper intestinal tract. She's feeling well at this point. She denies any GI symptoms. She was seen by GI medicine for consideration for endoscopy to look for the source of her Gist tumor but she does not want to have this procedure or have any sedation. She asked if there was any other way to evaluate her GI tract and we after discussion have decided we could go ahead with an upper GI series and small bowel follow-through understanding that it may not be quite sensitive.  Exam: BP 160/94  Pulse 77  Temp(Src) 98.1 F (36.7 C) (Temporal)  Ht 5' 3.5" (1.613 m)  Wt 173 lb 3.2 oz (78.563 kg)  BMI 30.2 kg/m2  SpO2 97% General: Appears well Abdomen: Soft and nontender and nondistended. No masses. 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 We will go ahead with upper GI series and small bowel follow-through I will call her with these results. If this is negative then I think that clinical followup would be most appropriate.

## 2013-02-07 ENCOUNTER — Other Ambulatory Visit (HOSPITAL_COMMUNITY): Payer: Medicare Other

## 2013-02-09 ENCOUNTER — Ambulatory Visit (HOSPITAL_COMMUNITY)
Admission: RE | Admit: 2013-02-09 | Discharge: 2013-02-09 | Disposition: A | Discharge status: Home / Self Care | Payer: Medicare Other | Source: Ambulatory Visit | Attending: General Surgery | Admitting: General Surgery

## 2013-02-09 DIAGNOSIS — D214 Benign neoplasm of connective and other soft tissue of abdomen: Secondary | ICD-10-CM

## 2013-02-09 DIAGNOSIS — K5710 Diverticulosis of small intestine without perforation or abscess without bleeding: Secondary | ICD-10-CM | POA: Insufficient documentation

## 2013-02-10 ENCOUNTER — Encounter (INDEPENDENT_AMBULATORY_CARE_PROVIDER_SITE_OTHER): Payer: Medicare Other | Admitting: General Surgery

## 2015-09-14 ENCOUNTER — Encounter (HOSPITAL_COMMUNITY): Payer: Self-pay | Admitting: Oncology

## 2015-09-14 ENCOUNTER — Emergency Department (HOSPITAL_COMMUNITY)
Admission: EM | Admit: 2015-09-14 | Discharge: 2015-09-14 | Disposition: A | Discharge status: Home / Self Care | Payer: Medicare Other | Attending: Emergency Medicine | Admitting: Emergency Medicine

## 2015-09-14 DIAGNOSIS — Y9389 Activity, other specified: Secondary | ICD-10-CM | POA: Diagnosis not present

## 2015-09-14 DIAGNOSIS — Z880 Allergy status to penicillin: Secondary | ICD-10-CM | POA: Insufficient documentation

## 2015-09-14 DIAGNOSIS — Y998 Other external cause status: Secondary | ICD-10-CM | POA: Diagnosis not present

## 2015-09-14 DIAGNOSIS — K148 Other diseases of tongue: Secondary | ICD-10-CM | POA: Diagnosis not present

## 2015-09-14 DIAGNOSIS — I10 Essential (primary) hypertension: Secondary | ICD-10-CM | POA: Insufficient documentation

## 2015-09-14 DIAGNOSIS — X58XXXA Exposure to other specified factors, initial encounter: Secondary | ICD-10-CM | POA: Diagnosis not present

## 2015-09-14 DIAGNOSIS — Z79899 Other long term (current) drug therapy: Secondary | ICD-10-CM | POA: Insufficient documentation

## 2015-09-14 DIAGNOSIS — R220 Localized swelling, mass and lump, head: Secondary | ICD-10-CM

## 2015-09-14 DIAGNOSIS — J45909 Unspecified asthma, uncomplicated: Secondary | ICD-10-CM | POA: Diagnosis not present

## 2015-09-14 DIAGNOSIS — Z87891 Personal history of nicotine dependence: Secondary | ICD-10-CM | POA: Insufficient documentation

## 2015-09-14 DIAGNOSIS — T7840XA Allergy, unspecified, initial encounter: Secondary | ICD-10-CM

## 2015-09-14 DIAGNOSIS — Y9289 Other specified places as the place of occurrence of the external cause: Secondary | ICD-10-CM | POA: Insufficient documentation

## 2015-09-14 DIAGNOSIS — E119 Type 2 diabetes mellitus without complications: Secondary | ICD-10-CM | POA: Diagnosis not present

## 2015-09-14 LAB — BASIC METABOLIC PANEL
ANION GAP: 10 (ref 5–15)
BUN: 11 mg/dL (ref 6–20)
CHLORIDE: 100 mmol/L — AB (ref 101–111)
CO2: 26 mmol/L (ref 22–32)
Calcium: 9.7 mg/dL (ref 8.9–10.3)
Creatinine, Ser: 1.04 mg/dL — ABNORMAL HIGH (ref 0.44–1.00)
GFR calc non Af Amer: 50 mL/min — ABNORMAL LOW (ref >60)
GFR, EST AFRICAN AMERICAN: 58 mL/min — AB (ref >60)
Glucose, Bld: 109 mg/dL — ABNORMAL HIGH (ref 65–99)
POTASSIUM: 3.7 mmol/L (ref 3.5–5.1)
SODIUM: 136 mmol/L (ref 135–145)

## 2015-09-14 LAB — CBC WITH DIFFERENTIAL/PLATELET
Basophils Absolute: 0 10*3/uL (ref 0.0–0.1)
Basophils Relative: 1 %
EOS ABS: 0.2 10*3/uL (ref 0.0–0.7)
Eosinophils Relative: 5 %
HEMATOCRIT: 37.3 % (ref 36.0–46.0)
HEMOGLOBIN: 12.4 g/dL (ref 12.0–15.0)
LYMPHS ABS: 0.9 10*3/uL (ref 0.7–4.0)
LYMPHS PCT: 22 %
MCH: 29.9 pg (ref 26.0–34.0)
MCHC: 33.2 g/dL (ref 30.0–36.0)
MCV: 89.9 fL (ref 78.0–100.0)
Monocytes Absolute: 0.4 10*3/uL (ref 0.1–1.0)
Monocytes Relative: 10 %
NEUTROS ABS: 2.5 10*3/uL (ref 1.7–7.7)
NEUTROS PCT: 62 %
Platelets: 264 10*3/uL (ref 150–400)
RBC: 4.15 MIL/uL (ref 3.87–5.11)
RDW: 12.7 % (ref 11.5–15.5)
WBC: 3.9 10*3/uL — AB (ref 4.0–10.5)

## 2015-09-14 MED ORDER — PREDNISONE 20 MG PO TABS: 40.0000 mg | ORAL_TABLET | Freq: Every day | ORAL | Status: DC

## 2015-09-14 MED ORDER — METHYLPREDNISOLONE SODIUM SUCC 125 MG IJ SOLR
125.0000 mg | Freq: Once | INTRAMUSCULAR | Status: AC
  Administered 2015-09-14: 125 mg via INTRAVENOUS
  Filled 2015-09-14: qty 2

## 2015-09-14 MED ORDER — DIPHENHYDRAMINE HCL 50 MG/ML IJ SOLN
25.0000 mg | Freq: Once | INTRAMUSCULAR | Status: AC
  Administered 2015-09-14: 25 mg via INTRAVENOUS
  Filled 2015-09-14: qty 1

## 2015-09-14 MED ORDER — FAMOTIDINE IN NACL 20-0.9 MG/50ML-% IV SOLN
20.0000 mg | Freq: Once | INTRAVENOUS | Status: AC
  Administered 2015-09-14: 20 mg via INTRAVENOUS
  Filled 2015-09-14: qty 50

## 2015-09-14 NOTE — Discharge Instructions (Signed)
Take Claritin or Zyrtec once a day for the next week. Take Benadryl as needed. Return if swelling is getting worse.   Allergies An allergy is an abnormal reaction to a substance by the body's defense system (immune system). Allergies can develop at any age. WHAT CAUSES ALLERGIES? An allergic reaction happens when the immune system mistakenly reacts to a normally harmless substance, called an allergen, as if it were harmful. The immune system releases antibodies to fight the substance. Antibodies eventually release a chemical called histamine into the bloodstream. The release of histamine is meant to protect the body from infection, but it also causes discomfort. An allergic reaction can be triggered by:  Eating an allergen.  Inhaling an allergen.  Touching an allergen. WHAT TYPES OF ALLERGIES ARE THERE? There are many types of allergies. Common types include:  Seasonal allergies. People with this type of allergy are usually allergic to substances that are only present during certain seasons, such as molds and pollens.  Food allergies.  Drug allergies.  Insect allergies.  Animal dander allergies. WHAT ARE SYMPTOMS OF ALLERGIES? Possible allergy symptoms include:  Swelling of the lips, face, tongue, mouth, or throat.  Sneezing, coughing, or wheezing.  Nasal congestion.  Tingling in the mouth.  Rash.  Itching.  Itchy, red, swollen areas of skin (hives).  Watery eyes.  Vomiting.  Diarrhea.  Dizziness.  Lightheadedness.  Fainting.  Trouble breathing or swallowing.  Chest tightness.  Rapid heartbeat. HOW ARE ALLERGIES DIAGNOSED? Allergies are diagnosed with a medical and family history and one or more of the following:  Skin tests.  Blood tests.  A food diary. A food diary is a record of all the foods and drinks you have in a day and of all the symptoms you experience.  The results of an elimination diet. An elimination diet involves eliminating foods  from your diet and then adding them back in one by one to find out if a certain food causes an allergic reaction. HOW ARE ALLERGIES TREATED? There is no cure for allergies, but allergic reactions can be treated with medicine. Severe reactions usually need to be treated at a hospital. HOW CAN REACTIONS BE PREVENTED? The best way to prevent an allergic reaction is by avoiding the substance you are allergic to. Allergy shots and medicines can also help prevent reactions in some cases. People with severe allergic reactions may be able to prevent a life-threatening reaction called anaphylaxis with a medicine given right after exposure to the allergen.   This information is not intended to replace advice given to you by your health care provider. Make sure you discuss any questions you have with your health care provider.   Document Released: 02/02/2003 Document Revised: 11/30/2014 Document Reviewed: 08/21/2014 Elsevier Interactive Patient Education 2016 Elsevier Inc.  Prednisone tablets What is this medicine? PREDNISONE (PRED ni sone) is a corticosteroid. It is commonly used to treat inflammation of the skin, joints, lungs, and other organs. Common conditions treated include asthma, allergies, and arthritis. It is also used for other conditions, such as blood disorders and diseases of the adrenal glands. This medicine may be used for other purposes; ask your health care provider or pharmacist if you have questions. What should I tell my health care provider before I take this medicine? They need to know if you have any of these conditions: -Cushing's syndrome -diabetes -glaucoma -heart disease -high blood pressure -infection (especially a virus infection such as chickenpox, cold sores, or herpes) -kidney disease -liver disease -mental illness -  myasthenia gravis -osteoporosis -seizures -stomach or intestine problems -thyroid disease -an unusual or allergic reaction to lactose, prednisone,  other medicines, foods, dyes, or preservatives -pregnant or trying to get pregnant -breast-feeding How should I use this medicine? Take this medicine by mouth with a glass of water. Follow the directions on the prescription label. Take this medicine with food. If you are taking this medicine once a day, take it in the morning. Do not take more medicine than you are told to take. Do not suddenly stop taking your medicine because you may develop a severe reaction. Your doctor will tell you how much medicine to take. If your doctor wants you to stop the medicine, the dose may be slowly lowered over time to avoid any side effects. Talk to your pediatrician regarding the use of this medicine in children. Special care may be needed. Overdosage: If you think you have taken too much of this medicine contact a poison control center or emergency room at once. NOTE: This medicine is only for you. Do not share this medicine with others. What if I miss a dose? If you miss a dose, take it as soon as you can. If it is almost time for your next dose, talk to your doctor or health care professional. You may need to miss a dose or take an extra dose. Do not take double or extra doses without advice. What may interact with this medicine? Do not take this medicine with any of the following medications: -metyrapone -mifepristone This medicine may also interact with the following medications: -aminoglutethimide -amphotericin B -aspirin and aspirin-like medicines -barbiturates -certain medicines for diabetes, like glipizide or glyburide -cholestyramine -cholinesterase inhibitors -cyclosporine -digoxin -diuretics -ephedrine -female hormones, like estrogens and birth control pills -isoniazid -ketoconazole -NSAIDS, medicines for pain and inflammation, like ibuprofen or naproxen -phenytoin -rifampin -toxoids -vaccines -warfarin This list may not describe all possible interactions. Give your health care  provider a list of all the medicines, herbs, non-prescription drugs, or dietary supplements you use. Also tell them if you smoke, drink alcohol, or use illegal drugs. Some items may interact with your medicine. What should I watch for while using this medicine? Visit your doctor or health care professional for regular checks on your progress. If you are taking this medicine over a prolonged period, carry an identification card with your name and address, the type and dose of your medicine, and your doctor's name and address. This medicine may increase your risk of getting an infection. Tell your doctor or health care professional if you are around anyone with measles or chickenpox, or if you develop sores or blisters that do not heal properly. If you are going to have surgery, tell your doctor or health care professional that you have taken this medicine within the last twelve months. Ask your doctor or health care professional about your diet. You may need to lower the amount of salt you eat. This medicine may affect blood sugar levels. If you have diabetes, check with your doctor or health care professional before you change your diet or the dose of your diabetic medicine. What side effects may I notice from receiving this medicine? Side effects that you should report to your doctor or health care professional as soon as possible: -allergic reactions like skin rash, itching or hives, swelling of the face, lips, or tongue -changes in emotions or moods -changes in vision -depressed mood -eye pain -fever or chills, cough, sore throat, pain or difficulty passing urine -increased thirst -swelling  of ankles, feet Side effects that usually do not require medical attention (report to your doctor or health care professional if they continue or are bothersome): -confusion, excitement, restlessness -headache -nausea, vomiting -skin problems, acne, thin and shiny skin -trouble sleeping -weight  gain This list may not describe all possible side effects. Call your doctor for medical advice about side effects. You may report side effects to FDA at 1-800-FDA-1088. Where should I keep my medicine? Keep out of the reach of children. Store at room temperature between 15 and 30 degrees C (59 and 86 degrees F). Protect from light. Keep container tightly closed. Throw away any unused medicine after the expiration date. NOTE: This sheet is a summary. It may not cover all possible information. If you have questions about this medicine, talk to your doctor, pharmacist, or health care provider.    2016, Elsevier/Gold Standard. (2011-06-25 10:57:14)

## 2015-09-14 NOTE — ED Notes (Signed)
Pt family member states pt feels a lot better.

## 2015-09-14 NOTE — ED Provider Notes (Signed)
CSN: [id number]     Arrival date & time 09/14/15  0131 History  By signing my name below, I, Irene Pap, attest that this documentation has been prepared under the direction and in the presence of Delora Fuel, MD. Electronically Signed: Irene Pap, ED Scribe. 09/14/2015. 2:40 AM.   Chief Complaint  Patient presents with  . Allergic Reaction   The history is provided by the patient. No language interpreter was used.  HPI Comments: Dawn [name] is a 80 y.o. Female with a hx of asthma, DM, and HTN who presents to the Emergency Department complaining of tongue swelling onset one day ago. Pt states that she ate orange and yellow peppers for the first time last night and began to experience the oral swelling and right lymph node swelling soon after. She states that she also ate fish last night. She denies itching, pain anywhere, trouble swallowing or SOB.   Past Medical History  Diagnosis Date  . Diabetes mellitus without complication ([place])   . Hypertension   . Asthma    Past Surgical History  Procedure Laterality Date  . Laparotomy  11/12/2012    Procedure: EXPLORATORY LAPAROTOMY;  Surgeon: Edward Jolly, MD;  Location: [place];  Service: General;  Laterality: N/A;  . Bowel resection  11/12/2012    Procedure: SMALL BOWEL RESECTION;  Surgeon: Edward Jolly, MD;  Location: [facility];  Service: General;  Laterality: N/A;   Family History  Problem Relation Age of Onset  . Cancer Mother   . Heart failure Father    Social History  Substance Use Topics  . Smoking status: Former Research scientist (life sciences)  . Smokeless tobacco: Former Systems developer    Quit date: 12/05/1992  . Alcohol Use: No   OB History    No data available     Review of Systems  HENT: Negative for trouble swallowing.        Tongue swelling; right lymph node swelling  Respiratory: Negative for shortness of breath.   All other systems reviewed and are negative.  Allergies  Penicillins  Home Medications   Prior to  Admission medications   Medication Sig Start Date End Date Taking? Authorizing Provider  acetaminophen (TYLENOL) 325 MG tablet Take 2 tablets (650 mg total) by mouth every 6 (six) hours as needed. 11/18/12   Robinette Haines, NP  albuterol (PROVENTIL HFA;VENTOLIN HFA) 108 (90 BASE) MCG/ACT inhaler Inhale 2 puffs into the lungs every 6 (six) hours as needed.    Historical Provider, MD  fosinopril (MONOPRIL) 10 MG tablet Take 10 mg by mouth daily.    Historical Provider, MD  glipiZIDE (GLUCOTROL) 10 MG tablet Take 10 mg by mouth daily with breakfast.    Historical Provider, MD  metFORMIN (GLUCOPHAGE) 500 MG tablet Take 500 mg by mouth daily after supper.    Historical Provider, MD  triamterene-hydrochlorothiazide (DYAZIDE) 37.5-25 MG per capsule Take 1 capsule by mouth every morning.    Historical Provider, MD   BP 153/84 mmHg  Pulse 50  Temp(Src) 99 F (37.2 C) (Oral)  Resp 15  Ht 5\' 4"  (1.626 m)  Wt 170 lb (77.111 kg)  BMI 29.17 kg/m2  SpO2 97%  Physical Exam  Constitutional: She is oriented to person, place, and time. She appears well-developed and well-nourished.  HENT:  Head: Normocephalic and atraumatic.  Moderate swelling of the right side of tongue and sublingual tissue. No pooling of secretions and normal phonation  Eyes: EOM are normal. Pupils are equal, round, and reactive to light.  Neck: Normal range of motion. Neck supple. No JVD present.  Cardiovascular: Normal rate, regular rhythm and normal heart sounds.  Exam reveals no gallop and no friction rub.   No murmur heard. Pulmonary/Chest: Effort normal and breath sounds normal. She has no wheezes. She has no rales.  Abdominal: Soft. Bowel sounds are normal. She exhibits no distension and no mass. There is no tenderness.  Musculoskeletal: Normal range of motion. She exhibits no edema.  Lymphadenopathy:    She has no cervical adenopathy.  Neurological: She is alert and oriented to person, place, and time. No cranial nerve  deficit. She exhibits normal muscle tone. Coordination normal.  Skin: Skin is warm and dry. No rash noted.  Psychiatric: She has a normal mood and affect. Her behavior is normal. Judgment and thought content normal.  Nursing note and vitals reviewed.   ED Course  Procedures (including critical care time) DIAGNOSTIC STUDIES: Oxygen Saturation is 97% on RA, normal by my interpretation.    COORDINATION OF CARE: 1:48 AM-Discussed treatment plan which includes labs, Benadryl, Solu-medrol, and Pepcid with pt at bedside and pt agreed to plan.   Labs Review Results for orders placed or performed during the hospital encounter of 16/10/96  Basic metabolic panel  Result Value Ref Range   Sodium 136 135 - 145 mmol/L   Potassium 3.7 3.5 - 5.1 mmol/L   Chloride 100 (L) 101 - 111 mmol/L   CO2 26 22 - 32 mmol/L   Glucose, Bld 109 (H) 65 - 99 mg/dL   BUN 11 6 - 20 mg/dL   Creatinine, Ser 1.04 (H) 0.44 - 1.00 mg/dL   Calcium 9.7 8.9 - 10.3 mg/dL   GFR calc non Af Amer 50 (L) >60 mL/min   GFR calc Af Amer 58 (L) >60 mL/min   Anion gap 10 5 - 15  CBC with Differential  Result Value Ref Range   WBC 3.9 (L) 4.0 - 10.5 K/uL   RBC 4.15 3.87 - 5.11 MIL/uL   Hemoglobin 12.4 12.0 - 15.0 g/dL   HCT 37.3 36.0 - 46.0 %   MCV 89.9 78.0 - 100.0 fL   MCH 29.9 26.0 - 34.0 pg   MCHC 33.2 30.0 - 36.0 g/dL   RDW 12.7 11.5 - 15.5 %   Platelets 264 150 - 400 K/uL   Neutrophils Relative % 62 %   Neutro Abs 2.5 1.7 - 7.7 K/uL   Lymphocytes Relative 22 %   Lymphs Abs 0.9 0.7 - 4.0 K/uL   Monocytes Relative 10 %   Monocytes Absolute 0.4 0.1 - 1.0 K/uL   Eosinophils Relative 5 %   Eosinophils Absolute 0.2 0.0 - 0.7 K/uL   Basophils Relative 1 %   Basophils Absolute 0.0 0.0 - 0.1 K/uL   I have personally reviewed and evaluated these lab results as part of my medical decision-making.   MDM   Final diagnoses:  Tongue swelling  Allergic reaction, initial encounter    Swelling of right side of tongue and  sublingual tissue. Possible allergy, possible occlusion of salivary duct. No evidence of airway compromise. She will be given diphenhydramine, famotidine, methylprednisolone and observed in the ED.  Tongue swelling has come down significantly. She is discharged with prescription for prednisone and advised to use over-the-counter nonsedating antihistamines for the next week. Return precautions given.  I, Camani Sesay, personally performed the services described in this documentation. All medical record entries made by the scribe were at my direction and in my presence.  I  have reviewed the chart and discharge instructions and agree that the record reflects my personal performance and is accurate and complete. Fallyn Munnerlyn.  03/88/8280. 1:58 AM.       Delora Fuel, MD 03/49/17 [id number]

## 2015-09-14 NOTE — ED Notes (Signed)
Pt presents d/t swollen tongue and right lymph node.  Per pt she ate orange and yellow peppers for the first time last night.  Shortly after that she began to notice the swelling.  Pt denies SOB.  Speech is clear.

## 2016-02-09 ENCOUNTER — Encounter (HOSPITAL_COMMUNITY): Payer: Self-pay | Admitting: Emergency Medicine

## 2016-02-09 ENCOUNTER — Emergency Department (HOSPITAL_COMMUNITY)
Admission: EM | Admit: 2016-02-09 | Discharge: 2016-02-09 | Disposition: A | Discharge status: Home / Self Care | Payer: Medicare Other | Attending: Emergency Medicine | Admitting: Emergency Medicine

## 2016-02-09 DIAGNOSIS — E119 Type 2 diabetes mellitus without complications: Secondary | ICD-10-CM | POA: Diagnosis not present

## 2016-02-09 DIAGNOSIS — J45909 Unspecified asthma, uncomplicated: Secondary | ICD-10-CM | POA: Insufficient documentation

## 2016-02-09 DIAGNOSIS — I10 Essential (primary) hypertension: Secondary | ICD-10-CM | POA: Diagnosis not present

## 2016-02-09 DIAGNOSIS — J029 Acute pharyngitis, unspecified: Secondary | ICD-10-CM | POA: Diagnosis not present

## 2016-02-09 DIAGNOSIS — Z880 Allergy status to penicillin: Secondary | ICD-10-CM | POA: Diagnosis not present

## 2016-02-09 DIAGNOSIS — R112 Nausea with vomiting, unspecified: Secondary | ICD-10-CM | POA: Insufficient documentation

## 2016-02-09 DIAGNOSIS — R1111 Vomiting without nausea: Secondary | ICD-10-CM

## 2016-02-09 DIAGNOSIS — Z79899 Other long term (current) drug therapy: Secondary | ICD-10-CM | POA: Diagnosis not present

## 2016-02-09 DIAGNOSIS — Z7984 Long term (current) use of oral hypoglycemic drugs: Secondary | ICD-10-CM | POA: Insufficient documentation

## 2016-02-09 DIAGNOSIS — Z87891 Personal history of nicotine dependence: Secondary | ICD-10-CM | POA: Insufficient documentation

## 2016-02-09 LAB — CBC
HCT: 39.3 % (ref 36.0–46.0)
HEMOGLOBIN: 12.8 g/dL (ref 12.0–15.0)
MCH: 29.9 pg (ref 26.0–34.0)
MCHC: 32.6 g/dL (ref 30.0–36.0)
MCV: 91.8 fL (ref 78.0–100.0)
Platelets: 254 10*3/uL (ref 150–400)
RBC: 4.28 MIL/uL (ref 3.87–5.11)
RDW: 13.1 % (ref 11.5–15.5)
WBC: 5.8 10*3/uL (ref 4.0–10.5)

## 2016-02-09 LAB — COMPREHENSIVE METABOLIC PANEL
ALT: 17 U/L (ref 14–54)
ANION GAP: 11 (ref 5–15)
AST: 25 U/L (ref 15–41)
Albumin: 4.6 g/dL (ref 3.5–5.0)
Alkaline Phosphatase: 35 U/L — ABNORMAL LOW (ref 38–126)
BUN: 9 mg/dL (ref 6–20)
CALCIUM: 9.8 mg/dL (ref 8.9–10.3)
CHLORIDE: 101 mmol/L (ref 101–111)
CO2: 28 mmol/L (ref 22–32)
Creatinine, Ser: 1.04 mg/dL — ABNORMAL HIGH (ref 0.44–1.00)
GFR, EST AFRICAN AMERICAN: 58 mL/min — AB (ref >60)
GFR, EST NON AFRICAN AMERICAN: 50 mL/min — AB (ref >60)
Glucose, Bld: 110 mg/dL — ABNORMAL HIGH (ref 65–99)
Potassium: 4 mmol/L (ref 3.5–5.1)
SODIUM: 140 mmol/L (ref 135–145)
Total Bilirubin: 0.7 mg/dL (ref 0.3–1.2)
Total Protein: 7.8 g/dL (ref 6.5–8.1)

## 2016-02-09 LAB — URINALYSIS, ROUTINE W REFLEX MICROSCOPIC
Bilirubin Urine: NEGATIVE
Glucose, UA: NEGATIVE mg/dL
HGB URINE DIPSTICK: NEGATIVE
Ketones, ur: NEGATIVE mg/dL
LEUKOCYTES UA: NEGATIVE
Nitrite: NEGATIVE
PROTEIN: NEGATIVE mg/dL
SPECIFIC GRAVITY, URINE: 1.015 (ref 1.005–1.030)
pH: 7 (ref 5.0–8.0)

## 2016-02-09 LAB — LIPASE, BLOOD: LIPASE: 33 U/L (ref 11–51)

## 2016-02-09 MED ORDER — MAGIC MOUTHWASH
5.0000 mL | Freq: Once | ORAL | Status: AC
  Administered 2016-02-09: 5 mL via ORAL
  Filled 2016-02-09: qty 5

## 2016-02-09 MED ORDER — ONDANSETRON 4 MG PO TBDP
4.0000 mg | ORAL_TABLET | Freq: Once | ORAL | Status: AC
  Administered 2016-02-09: 4 mg via ORAL
  Filled 2016-02-09: qty 1

## 2016-02-09 MED ORDER — ONDANSETRON 4 MG PO TBDP: 4.0000 mg | ORAL_TABLET | Freq: Two times a day (BID) | ORAL | Status: DC

## 2016-02-09 NOTE — Discharge Instructions (Signed)
You have been given a prescription for medication called.  Zofran.  You can take this under your tongue for nausea and vomiting  If you have persistent episodes.  Please make an appointment with your primary care physician or return to the emergency room for further evaluation   Nausea and Vomiting Nausea means you feel sick to your stomach. Throwing up (vomiting) is a reflex where stomach contents come out of your mouth. HOME CARE   Take medicine as told by your doctor.  Do not force yourself to eat. However, you do need to drink fluids.  If you feel like eating, eat a normal diet as told by your doctor.  Eat rice, wheat, potatoes, bread, lean meats, yogurt, fruits, and vegetables.  Avoid high-fat foods.  Drink enough fluids to keep your pee (urine) clear or pale yellow.  Ask your doctor how to replace body fluid losses (rehydrate). Signs of body fluid loss (dehydration) include:  Feeling very thirsty.  Dry lips and mouth.  Feeling dizzy.  Dark pee.  Peeing less than normal.  Feeling confused.  Fast breathing or heart rate. GET HELP RIGHT AWAY IF:   You have blood in your throw up.  You have black or bloody poop (stool).  You have a bad headache or stiff neck.  You feel confused.  You have bad belly (abdominal) pain.  You have chest pain or trouble breathing.  You do not pee at least once every 8 hours.  You have cold, clammy skin.  You keep throwing up after 24 to 48 hours.  You have a fever. MAKE SURE YOU:   Understand these instructions.  Will watch your condition.  Will get help right away if you are not doing well or get worse.   This information is not intended to replace advice given to you by your health care provider. Make sure you discuss any questions you have with your health care provider.   Document Released: 04/27/2008 Document Revised: 02/01/2012 Document Reviewed: 04/10/2011 Elsevier Interactive Patient Education [employer].

## 2016-02-09 NOTE — ED Provider Notes (Signed)
CSN: [id number]     Arrival date & time 02/09/16  0007 History   First MD Initiated Contact with Patient 02/09/16 [phone]     Chief Complaint  Patient presents with  . Emesis     (Consider location/radiation/quality/duration/timing/severity/associated sxs/prior Treatment) HPI Comments: Patient states she went to bed in her normal state of health when she was awakened approximately one hour later with multiple episodes of emesis.  She now is complaining of throat discomfort.  She noted the last time she had a emesis that there was slight blood in it.  This concerned her, thus ED visit. She states she has no abdominal discomfort.  She has occasional episodes of nausea, but no further episodes of vomiting, no diarrhea  Patient is a 81 y.o. female presenting with vomiting. The history is provided by the patient.  Emesis Severity:  Mild Timing:  Intermittent Quality:  Bilious material Progression:  Improving Chronicity:  New Recent urination:  Normal Associated symptoms: sore throat     Past Medical History  Diagnosis Date  . Diabetes mellitus without complication ([place])   . Hypertension   . Asthma    Past Surgical History  Procedure Laterality Date  . Laparotomy  11/12/2012    Procedure: EXPLORATORY LAPAROTOMY;  Surgeon: Edward Jolly, MD;  Location: [place];  Service: General;  Laterality: N/A;  . Bowel resection  11/12/2012    Procedure: SMALL BOWEL RESECTION;  Surgeon: Edward Jolly, MD;  Location: [facility];  Service: General;  Laterality: N/A;   Family History  Problem Relation Age of Onset  . Cancer Mother   . Heart failure Father    Social History  Substance Use Topics  . Smoking status: Former Research scientist (life sciences)  . Smokeless tobacco: Former Systems developer    Quit date: 12/05/1992  . Alcohol Use: No   OB History    No data available     Review of Systems  Constitutional: Negative for fever.  HENT: Positive for sore throat.   Respiratory: Negative for cough.   Gastrointestinal:  Positive for vomiting. Negative for abdominal distention.  Neurological: Negative for dizziness.  All other systems reviewed and are negative.     Allergies  Penicillins and Shellfish allergy  Home Medications   Prior to Admission medications   Medication Sig Start Date End Date Taking? Authorizing Provider  albuterol (PROVENTIL HFA;VENTOLIN HFA) 108 (90 BASE) MCG/ACT inhaler Inhale 2 puffs into the lungs every 6 (six) hours as needed for wheezing.    Yes Historical Provider, MD  fosinopril (MONOPRIL) 10 MG tablet Take 10 mg by mouth daily.   Yes Historical Provider, MD  glipiZIDE (GLUCOTROL) 10 MG tablet Take 10 mg by mouth daily with breakfast.   Yes Historical Provider, MD  metFORMIN (GLUCOPHAGE-XR) 500 MG 24 hr tablet Take 500 mg by mouth daily with breakfast.  07/04/15  Yes Historical Provider, MD  triamterene-hydrochlorothiazide (DYAZIDE) 37.5-25 MG per capsule Take 1 capsule by mouth every morning.   Yes Historical Provider, MD  acetaminophen (TYLENOL) 325 MG tablet Take 2 tablets (650 mg total) by mouth every 6 (six) hours as needed. Patient not taking: Reported on 02/09/2016 11/18/12   Robinette Haines, NP  ondansetron (ZOFRAN-ODT) 4 MG disintegrating tablet Take 1 tablet (4 mg total) by mouth 2 (two) times daily. 02/09/16   Junius Creamer, NP  predniSONE (DELTASONE) 20 MG tablet Take 2 tablets (40 mg total) by mouth daily. Patient not taking: Reported on 02/09/2016 123456   Delora Fuel, MD   BP 123456  mmHg  Pulse 82  Temp(Src) 98.6 F (37 C) (Oral)  Resp 20  SpO2 92% Physical Exam  Constitutional: She is oriented to person, place, and time. She appears well-developed and well-nourished.  HENT:  Head: Normocephalic.  Right Ear: External ear normal.  Left Ear: External ear normal.  Mouth/Throat: Oropharynx is clear and moist.  Eyes: Pupils are equal, round, and reactive to light.  Neck: Normal range of motion.  Cardiovascular: Normal rate and regular rhythm.    Pulmonary/Chest: Effort normal and breath sounds normal.  Abdominal: Soft. Bowel sounds are normal. She exhibits no distension.  Musculoskeletal: Normal range of motion.  Lymphadenopathy:    She has no cervical adenopathy.  Neurological: She is alert and oriented to person, place, and time.  Skin: Skin is warm and dry.  Nursing note and vitals reviewed.   ED Course  Procedures (including critical care time) Labs Review Labs Reviewed  COMPREHENSIVE METABOLIC PANEL - Abnormal; Notable for the following:    Glucose, Bld 110 (*)    Creatinine, Ser 1.04 (*)    Alkaline Phosphatase 35 (*)    GFR calc non Af Amer 50 (*)    GFR calc Af Amer 58 (*)    All other components within normal limits  URINALYSIS, ROUTINE W REFLEX MICROSCOPIC (NOT AT [facility]) - Abnormal; Notable for the following:    APPearance CLOUDY (*)    All other components within normal limits  LIPASE, BLOOD  CBC    Imaging Review No results found. I have personally reviewed and evaluated these images and lab results as part of my medical decision-making.   EKG Interpretation None     Will give ODT Zofran and magic mouth wash for symptom relief Patient has had no more episodes of nausea or vomiting.  She has no abdominal discomfort.  She states after using the Magic mouthwash.  He is no longer having any discomfort in her throat. MDM   Final diagnoses:  Non-intractable vomiting without nausea, vomiting of unspecified type         Junius Creamer, NP 02/09/16 [place], MD 02/10/16 [phone]

## 2016-02-09 NOTE — ED Notes (Signed)
Pt states she felt fine when she went to bed around 2200 she felt fine, but she woke up around 2300 she was feeling nauseous and had 4 episodes of emesis. Pt states she had blood in her mouth after she threw up and now has c/o throat pain.

## 2018-04-08 DIAGNOSIS — I10 Essential (primary) hypertension: Secondary | ICD-10-CM | POA: Diagnosis not present

## 2018-04-08 DIAGNOSIS — E118 Type 2 diabetes mellitus with unspecified complications: Secondary | ICD-10-CM | POA: Diagnosis not present

## 2018-04-15 DIAGNOSIS — Z1212 Encounter for screening for malignant neoplasm of rectum: Secondary | ICD-10-CM | POA: Diagnosis not present

## 2018-04-15 DIAGNOSIS — Z1211 Encounter for screening for malignant neoplasm of colon: Secondary | ICD-10-CM | POA: Diagnosis not present

## 2018-04-20 DIAGNOSIS — Z1231 Encounter for screening mammogram for malignant neoplasm of breast: Secondary | ICD-10-CM | POA: Diagnosis not present

## 2018-04-20 DIAGNOSIS — M810 Age-related osteoporosis without current pathological fracture: Secondary | ICD-10-CM | POA: Diagnosis not present

## 2018-04-20 DIAGNOSIS — M8589 Other specified disorders of bone density and structure, multiple sites: Secondary | ICD-10-CM | POA: Diagnosis not present

## 2018-04-22 DIAGNOSIS — E785 Hyperlipidemia, unspecified: Secondary | ICD-10-CM | POA: Diagnosis not present

## 2018-04-22 DIAGNOSIS — I10 Essential (primary) hypertension: Secondary | ICD-10-CM | POA: Diagnosis not present

## 2018-04-22 DIAGNOSIS — E1100 Type 2 diabetes mellitus with hyperosmolarity without nonketotic hyperglycemic-hyperosmolar coma (NKHHC): Secondary | ICD-10-CM | POA: Diagnosis not present

## 2018-06-06 DIAGNOSIS — E118 Type 2 diabetes mellitus with unspecified complications: Secondary | ICD-10-CM | POA: Diagnosis not present

## 2018-06-06 DIAGNOSIS — R253 Fasciculation: Secondary | ICD-10-CM | POA: Diagnosis not present

## 2018-06-06 DIAGNOSIS — I10 Essential (primary) hypertension: Secondary | ICD-10-CM | POA: Diagnosis not present

## 2018-06-06 DIAGNOSIS — E785 Hyperlipidemia, unspecified: Secondary | ICD-10-CM | POA: Diagnosis not present

## 2018-07-06 DIAGNOSIS — I10 Essential (primary) hypertension: Secondary | ICD-10-CM | POA: Diagnosis not present

## 2018-07-06 DIAGNOSIS — E785 Hyperlipidemia, unspecified: Secondary | ICD-10-CM | POA: Diagnosis not present

## 2018-07-06 DIAGNOSIS — E1100 Type 2 diabetes mellitus with hyperosmolarity without nonketotic hyperglycemic-hyperosmolar coma (NKHHC): Secondary | ICD-10-CM | POA: Diagnosis not present

## 2018-07-06 DIAGNOSIS — Z6828 Body mass index (BMI) 28.0-28.9, adult: Secondary | ICD-10-CM | POA: Diagnosis not present

## 2018-07-10 DIAGNOSIS — E785 Hyperlipidemia, unspecified: Secondary | ICD-10-CM | POA: Diagnosis not present

## 2018-07-10 DIAGNOSIS — H547 Unspecified visual loss: Secondary | ICD-10-CM | POA: Diagnosis not present

## 2018-07-10 DIAGNOSIS — N183 Chronic kidney disease, stage 3 (moderate): Secondary | ICD-10-CM | POA: Diagnosis not present

## 2018-07-10 DIAGNOSIS — M19042 Primary osteoarthritis, left hand: Secondary | ICD-10-CM | POA: Diagnosis not present

## 2018-07-10 DIAGNOSIS — M19041 Primary osteoarthritis, right hand: Secondary | ICD-10-CM | POA: Diagnosis not present

## 2018-07-10 DIAGNOSIS — I129 Hypertensive chronic kidney disease with stage 1 through stage 4 chronic kidney disease, or unspecified chronic kidney disease: Secondary | ICD-10-CM | POA: Diagnosis not present

## 2018-07-10 DIAGNOSIS — Z6825 Body mass index (BMI) 25.0-25.9, adult: Secondary | ICD-10-CM | POA: Diagnosis not present

## 2018-07-10 DIAGNOSIS — E1122 Type 2 diabetes mellitus with diabetic chronic kidney disease: Secondary | ICD-10-CM | POA: Diagnosis not present

## 2018-07-10 DIAGNOSIS — R062 Wheezing: Secondary | ICD-10-CM | POA: Diagnosis not present

## 2018-10-05 DIAGNOSIS — Z0000 Encounter for general adult medical examination without abnormal findings: Secondary | ICD-10-CM | POA: Diagnosis not present

## 2018-10-05 DIAGNOSIS — I10 Essential (primary) hypertension: Secondary | ICD-10-CM | POA: Diagnosis not present

## 2018-10-05 DIAGNOSIS — E785 Hyperlipidemia, unspecified: Secondary | ICD-10-CM | POA: Diagnosis not present

## 2018-10-05 DIAGNOSIS — E1100 Type 2 diabetes mellitus with hyperosmolarity without nonketotic hyperglycemic-hyperosmolar coma (NKHHC): Secondary | ICD-10-CM | POA: Diagnosis not present

## 2019-02-07 DIAGNOSIS — E1169 Type 2 diabetes mellitus with other specified complication: Secondary | ICD-10-CM | POA: Diagnosis not present

## 2019-02-07 DIAGNOSIS — Z6826 Body mass index (BMI) 26.0-26.9, adult: Secondary | ICD-10-CM | POA: Diagnosis not present

## 2019-02-07 DIAGNOSIS — I10 Essential (primary) hypertension: Secondary | ICD-10-CM | POA: Diagnosis not present

## 2019-02-07 DIAGNOSIS — E785 Hyperlipidemia, unspecified: Secondary | ICD-10-CM | POA: Diagnosis not present

## 2019-03-01 DIAGNOSIS — I10 Essential (primary) hypertension: Secondary | ICD-10-CM | POA: Diagnosis not present

## 2019-03-01 DIAGNOSIS — E785 Hyperlipidemia, unspecified: Secondary | ICD-10-CM | POA: Diagnosis not present

## 2019-03-01 DIAGNOSIS — E1169 Type 2 diabetes mellitus with other specified complication: Secondary | ICD-10-CM | POA: Diagnosis not present

## 2019-05-31 DIAGNOSIS — I10 Essential (primary) hypertension: Secondary | ICD-10-CM | POA: Diagnosis not present

## 2019-07-03 DIAGNOSIS — I10 Essential (primary) hypertension: Secondary | ICD-10-CM | POA: Diagnosis not present

## 2019-07-03 DIAGNOSIS — E782 Mixed hyperlipidemia: Secondary | ICD-10-CM | POA: Diagnosis not present

## 2019-08-02 DIAGNOSIS — Z1231 Encounter for screening mammogram for malignant neoplasm of breast: Secondary | ICD-10-CM | POA: Diagnosis not present

## 2019-08-02 DIAGNOSIS — R634 Abnormal weight loss: Secondary | ICD-10-CM | POA: Diagnosis not present

## 2019-08-02 DIAGNOSIS — M810 Age-related osteoporosis without current pathological fracture: Secondary | ICD-10-CM | POA: Diagnosis not present

## 2019-11-23 DIAGNOSIS — I10 Essential (primary) hypertension: Secondary | ICD-10-CM | POA: Diagnosis not present

## 2020-01-02 DIAGNOSIS — I10 Essential (primary) hypertension: Secondary | ICD-10-CM | POA: Diagnosis not present

## 2020-01-02 DIAGNOSIS — E1169 Type 2 diabetes mellitus with other specified complication: Secondary | ICD-10-CM | POA: Diagnosis not present

## 2020-01-16 DIAGNOSIS — E1169 Type 2 diabetes mellitus with other specified complication: Secondary | ICD-10-CM | POA: Diagnosis not present

## 2020-01-16 DIAGNOSIS — E785 Hyperlipidemia, unspecified: Secondary | ICD-10-CM | POA: Diagnosis not present

## 2020-01-16 DIAGNOSIS — I10 Essential (primary) hypertension: Secondary | ICD-10-CM | POA: Diagnosis not present

## 2020-01-22 ENCOUNTER — Ambulatory Visit: Payer: Medicare Other | Attending: Internal Medicine

## 2020-01-22 DIAGNOSIS — Z23 Encounter for immunization: Secondary | ICD-10-CM

## 2020-01-22 NOTE — Progress Notes (Signed)
   Covid-19 Vaccination Clinic  Name:  Dawn [name]    MRN: [id number] DOB: 12-Jan-1935  01/22/2020  Dawn [name] was observed post Covid-19 immunization for 15 minutes without incidence. She was provided with Vaccine Information Sheet and instruction to access the V-Safe system.   Dawn [name] was instructed to call 911 with any severe reactions post vaccine: Marland Kitchen Difficulty breathing  . Swelling of your face and throat  . A fast heartbeat  . A bad rash all over your body  . Dizziness and weakness    Immunizations Administered    Name Date Dose VIS Date Route   Pfizer COVID-19 Vaccine 01/22/2020  2:04 PM 0.3 mL 11/03/2019 Intramuscular   Manufacturer: [place]   Lot: [id number]   [place]: [id number]

## 2020-02-14 ENCOUNTER — Ambulatory Visit: Payer: Medicare Other | Attending: Internal Medicine

## 2020-02-14 DIAGNOSIS — Z23 Encounter for immunization: Secondary | ICD-10-CM

## 2020-02-14 NOTE — Progress Notes (Signed)
   Covid-19 Vaccination Clinic  Name:  ABEERA BODEN    MRN: [id number] DOB: 10/01/1935  02/14/2020  Ms. Suppa was observed post Covid-19 immunization for 30 minutes based on pre-vaccination screening without incident. She was provided with Vaccine Information Sheet and instruction to access the V-Safe system.   Ms. Romanik was instructed to call 911 with any severe reactions post vaccine: Marland Kitchen Difficulty breathing  . Swelling of face and throat  . A fast heartbeat  . A bad rash all over body  . Dizziness and weakness   Immunizations Administered    Name Date Dose VIS Date Route   Pfizer COVID-19 Vaccine 02/14/2020  1:31 PM 0.3 mL 11/03/2019 Intramuscular   Manufacturer: [place]   Lot: [id number]   [place]: [id number]

## 2020-04-16 DIAGNOSIS — E782 Mixed hyperlipidemia: Secondary | ICD-10-CM | POA: Diagnosis not present

## 2020-04-16 DIAGNOSIS — I10 Essential (primary) hypertension: Secondary | ICD-10-CM | POA: Diagnosis not present

## 2020-04-16 DIAGNOSIS — E1169 Type 2 diabetes mellitus with other specified complication: Secondary | ICD-10-CM | POA: Diagnosis not present

## 2020-04-16 DIAGNOSIS — Z9181 History of falling: Secondary | ICD-10-CM | POA: Diagnosis not present

## 2020-06-17 DIAGNOSIS — E1169 Type 2 diabetes mellitus with other specified complication: Secondary | ICD-10-CM | POA: Diagnosis not present

## 2020-06-17 DIAGNOSIS — E782 Mixed hyperlipidemia: Secondary | ICD-10-CM | POA: Diagnosis not present

## 2020-06-17 DIAGNOSIS — I10 Essential (primary) hypertension: Secondary | ICD-10-CM | POA: Diagnosis not present

## 2020-09-06 ENCOUNTER — Other Ambulatory Visit: Payer: Self-pay

## 2020-09-06 ENCOUNTER — Encounter (HOSPITAL_COMMUNITY): Payer: Self-pay

## 2020-09-06 ENCOUNTER — Ambulatory Visit (INDEPENDENT_AMBULATORY_CARE_PROVIDER_SITE_OTHER): Payer: Medicare PPO

## 2020-09-06 ENCOUNTER — Ambulatory Visit (HOSPITAL_COMMUNITY)
Admission: EM | Admit: 2020-09-06 | Discharge: 2020-09-06 | Disposition: A | Discharge status: Home / Self Care | Payer: Medicare PPO | Attending: Emergency Medicine | Admitting: Emergency Medicine

## 2020-09-06 DIAGNOSIS — S32120A Nondisplaced Zone II fracture of sacrum, initial encounter for closed fracture: Secondary | ICD-10-CM

## 2020-09-06 DIAGNOSIS — M533 Sacrococcygeal disorders, not elsewhere classified: Secondary | ICD-10-CM | POA: Diagnosis not present

## 2020-09-06 DIAGNOSIS — M545 Low back pain, unspecified: Secondary | ICD-10-CM | POA: Diagnosis not present

## 2020-09-06 DIAGNOSIS — R102 Pelvic and perineal pain: Secondary | ICD-10-CM | POA: Diagnosis not present

## 2020-09-06 MED ORDER — TRAMADOL HCL 50 MG PO TABS: 50.0000 mg | ORAL_TABLET | Freq: Four times a day (QID) | ORAL | 0 refills | Status: DC | PRN

## 2020-09-06 NOTE — ED Triage Notes (Signed)
Pt states she fell about an hour ago and now has bilateral hip pain. Pt ambulates but states it is with pain and has to walk slower than normal to keep the pain manageable. Pt is aox4 and ambulatory.

## 2020-09-06 NOTE — Discharge Instructions (Signed)
Sacral fracture- follow up with sports medicine/orthopedics- contacts below Please pick up walker from medical supply store  [place] [address]  Alvera Singh Tylenol Tramadol  for severe pain

## 2020-09-06 NOTE — ED Provider Notes (Signed)
[place]    CSN: [id number] Arrival date & time: 09/06/20  1124      History   Chief Complaint Chief Complaint  Patient presents with   Hip Pain    bilateral post fall x 1 hour ago    HPI Dawn [name] is a 85 y.o. female with PMH of diabetes and hypertension who presents with a recent history of a fall this morning.  Patient states that she grabbed a door and her grip slipped and she came back down landing primarily on her glutes, her tailbone, and back.  She denies any loss of consciousness, dizziness.  Denies any head contact during the fall.  Patient states that she has pain directly on her tailbone that hurts when moving and when touched.  It is affecting her ability to walk straight.  Denies any urinary changes, bowel changes, weakness in the lower legs.   Past Medical History:  Diagnosis Date   Asthma    Diabetes mellitus without complication ([place])    Hypertension     Patient Active Problem List   Diagnosis Date Noted   SBO (small bowel obstruction) ([place]) 11/14/2012    Past Surgical History:  Procedure Laterality Date   BOWEL RESECTION  11/12/2012   Procedure: SMALL BOWEL RESECTION;  Surgeon: Edward Jolly, MD;  Location: [place];  Service: General;  Laterality: N/A;   LAPAROTOMY  11/12/2012   Procedure: EXPLORATORY LAPAROTOMY;  Surgeon: Edward Jolly, MD;  Location: [facility];  Service: General;  Laterality: N/A;    OB History   No obstetric history on file.      Home Medications    Prior to Admission medications   Medication Sig Start Date End Date Taking? Authorizing Provider  albuterol (PROVENTIL HFA;VENTOLIN HFA) 108 (90 BASE) MCG/ACT inhaler Inhale 2 puffs into the lungs every 6 (six) hours as needed for wheezing.    Yes [provider]  glipiZIDE (GLUCOTROL) 10 MG tablet Take 10 mg by mouth daily with breakfast.   Yes [provider]  metFORMIN (GLUCOPHAGE-XR) 500 MG 24 hr tablet Take 500 mg by mouth  daily with breakfast.  07/04/15  Yes [provider]  triamterene-hydrochlorothiazide (DYAZIDE) 37.5-25 MG per capsule Take 1 capsule by mouth every morning.   Yes [provider]  fosinopril (MONOPRIL) 10 MG tablet Take 10 mg by mouth daily.    [provider]  ondansetron (ZOFRAN-ODT) 4 MG disintegrating tablet Take 1 tablet (4 mg total) by mouth 2 (two) times daily. 02/09/16   Junius Creamer, NP  traMADol (ULTRAM) 50 MG tablet Take 1 tablet (50 mg total) by mouth every 6 (six) hours as needed. 09/06/20   Derrel Moore, Elesa Hacker, PA-C    Family History Family History  Problem Relation Age of Onset   Cancer Mother    Heart failure Father     Social History Social History   Tobacco Use   Smoking status: Former Smoker   Smokeless tobacco: Former Systems developer    Quit date: 12/05/1992  Vaping Use   Vaping Use: Never used  Substance Use Topics   Alcohol use: No   Drug use: No     Allergies   Penicillins and Shellfish allergy   Review of Systems Review of Systems  Constitutional: Negative for activity change, chills, diaphoresis and fatigue.  HENT: Negative for ear pain, tinnitus and trouble swallowing.   Eyes: Negative for photophobia and visual disturbance.  Respiratory: Negative for cough, chest tightness and shortness of breath.  Cardiovascular: Negative for chest pain and leg swelling.  Gastrointestinal: Negative for abdominal pain, blood in stool, nausea and vomiting.  Genitourinary: Negative for decreased urine volume and difficulty urinating.  Musculoskeletal: Positive for back pain and gait problem. Negative for arthralgias, myalgias, neck pain and neck stiffness.  Skin: Negative for color change and wound.  Neurological: Negative for dizziness, weakness, light-headedness, numbness and headaches.     Physical Exam Triage Vital Signs ED Triage Vitals  Enc Vitals Group     BP 09/06/20 1213 (!) 150/61     Pulse Rate 09/06/20 1213 86     Resp  09/06/20 1213 18     Temp 09/06/20 1213 98.3 F (36.8 C)     Temp Source 09/06/20 1213 Oral     SpO2 09/06/20 1213 100 %     Weight --      Height --      Head Circumference --      Peak Flow --      Pain Score 09/06/20 1216 0     Pain Loc --      Pain Edu? --      Excl. in [place]? --    No data found.  Updated Vital Signs BP (!) 150/61 (BP Location: Right Arm)    Pulse 86    Temp 98.3 F (36.8 C) (Oral)    Resp 18    SpO2 100%       Physical Exam Vitals and nursing note reviewed.  Constitutional:      Appearance: She is well-developed.     Comments: No acute distress  HENT:     Head: Normocephalic and atraumatic.     Nose: Nose normal.  Eyes:     Extraocular Movements: Extraocular movements intact.     Conjunctiva/sclera: Conjunctivae normal.     Pupils: Pupils are equal, round, and reactive to light.  Cardiovascular:     Rate and Rhythm: Normal rate.  Pulmonary:     Effort: Pulmonary effort is normal. No respiratory distress.  Abdominal:     General: There is no distension.  Musculoskeletal:        General: Tenderness and signs of injury present. Normal range of motion.     Cervical back: Neck supple. No rigidity or tenderness.       Legs:     Comments: Patient admits to point tenderness along the sacrum and coccyx.  No bruising noted.  No swelling noted. Hip mobility in all planes is unimpeded without pain.  Strength at hips 5/5 and equal bilaterally  Skin:    General: Skin is warm and dry.  Neurological:     Mental Status: She is alert and oriented to person, place, and time.      UC Treatments / Results  Labs (all labs ordered are listed, but only abnormal results are displayed) Labs Reviewed - No data to display  EKG   Radiology DG Pelvis 1-2 Views  Result Date: 09/06/2020 CLINICAL DATA:  Pain following fall EXAM: PELVIS - 1-2 VIEW COMPARISON:  Sacrum and coccyx September 06, 2020 FINDINGS: Frontal and lateral views obtained. Suspected fracture of  the left sacrum is not appreciable on this examination. Endplate concavity noted at L4 and L5 which may represent osteoporosis type fractures. No other evident fracture. No dislocation. There is mild symmetric narrowing of each hip joint. No erosive change. IMPRESSION: Endplate concavity at L4 and L5 may represent osteoporotic type fractures. No other findings suggesting fracture. Suspected fracture mid sacrum on the  left seen on sacral images not appreciable on this examination. No dislocation. Slight narrowing each hip joint, symmetric. Electronically Signed   By: Lowella Grip III M.D.   On: 09/06/2020 14:12   DG Sacrum/Coccyx  Result Date: 09/06/2020 CLINICAL DATA:  Pain following fall EXAM: SACRUM AND COCCYX - 2+ VIEW COMPARISON:  None. FINDINGS: Frontal, angled frontal, and lateral views were obtained. There is a transverse lucency in the mid left sacral ala, concerning for fracture in this area. No other findings suggesting fracture. No diastasis. The joint spaces appear normal. IMPRESSION: Area of lucency in the mid sacrum on the left, concerning for fracture. No other findings suggesting fracture. No diastasis. No appreciable arthropathy. These results will be called to the ordering clinician or representative by the Radiologist Assistant, and communication documented in the PACS or [employer]. Electronically Signed   By: Lowella Grip III M.D.   On: 09/06/2020 14:10    Procedures Procedures (including critical care time)  Medications Ordered in UC Medications - No data to display  Initial Impression / Assessment and Plan / UC Course  I have reviewed the triage vital signs and the nursing notes.  Pertinent labs & imaging results that were available during my care of the patient were reviewed by me and considered in my medical decision making (see chart for details).     Possible mid sacral lucency/fracture, recommending ambulating with walker, anti-inflammatories, tramadol  for severe pain.  Follow-up with orthopedics.  Discussed strict return precautions. Patient verbalized understanding and is agreeable with plan.  Final Clinical Impressions(s) / UC Diagnoses   Final diagnoses:  Closed nondisplaced zone II fracture of sacrum, initial encounter [facility])     Discharge Instructions     Sacral fracture- follow up with sports medicine/orthopedics- contacts below Please pick up walker from medical supply store  [place] [address]  Alvera Singh Tylenol Tramadol  for severe pain     ED Prescriptions    Medication Sig Dispense Auth. Provider   traMADol (ULTRAM) 50 MG tablet Take 1 tablet (50 mg total) by mouth every 6 (six) hours as needed. 12 tablet Vance Hochmuth, [place] C, PA-C     I have reviewed the PDMP during this encounter.   Janith Lima, [place] 09/06/20 1516

## 2020-09-17 DIAGNOSIS — I10 Essential (primary) hypertension: Secondary | ICD-10-CM | POA: Diagnosis not present

## 2020-09-17 DIAGNOSIS — E785 Hyperlipidemia, unspecified: Secondary | ICD-10-CM | POA: Diagnosis not present

## 2020-09-17 DIAGNOSIS — Z23 Encounter for immunization: Secondary | ICD-10-CM | POA: Diagnosis not present

## 2020-09-17 DIAGNOSIS — E7800 Pure hypercholesterolemia, unspecified: Secondary | ICD-10-CM | POA: Diagnosis not present

## 2020-09-17 DIAGNOSIS — E1169 Type 2 diabetes mellitus with other specified complication: Secondary | ICD-10-CM | POA: Diagnosis not present

## 2020-09-20 DIAGNOSIS — Z23 Encounter for immunization: Secondary | ICD-10-CM | POA: Diagnosis not present

## 2020-10-09 DIAGNOSIS — Z23 Encounter for immunization: Secondary | ICD-10-CM | POA: Diagnosis not present

## 2020-10-29 DIAGNOSIS — E785 Hyperlipidemia, unspecified: Secondary | ICD-10-CM | POA: Diagnosis not present

## 2020-10-29 DIAGNOSIS — Z9181 History of falling: Secondary | ICD-10-CM | POA: Diagnosis not present

## 2020-10-29 DIAGNOSIS — E1169 Type 2 diabetes mellitus with other specified complication: Secondary | ICD-10-CM | POA: Diagnosis not present

## 2020-10-29 DIAGNOSIS — E118 Type 2 diabetes mellitus with unspecified complications: Secondary | ICD-10-CM | POA: Diagnosis not present

## 2021-01-28 DIAGNOSIS — E1169 Type 2 diabetes mellitus with other specified complication: Secondary | ICD-10-CM | POA: Diagnosis not present

## 2021-01-28 DIAGNOSIS — R634 Abnormal weight loss: Secondary | ICD-10-CM | POA: Diagnosis not present

## 2021-01-28 DIAGNOSIS — E789 Disorder of lipoprotein metabolism, unspecified: Secondary | ICD-10-CM | POA: Diagnosis not present

## 2021-01-28 DIAGNOSIS — E782 Mixed hyperlipidemia: Secondary | ICD-10-CM | POA: Diagnosis not present

## 2021-01-28 DIAGNOSIS — I10 Essential (primary) hypertension: Secondary | ICD-10-CM | POA: Diagnosis not present

## 2021-01-28 DIAGNOSIS — R413 Other amnesia: Secondary | ICD-10-CM | POA: Diagnosis not present

## 2021-01-28 DIAGNOSIS — E785 Hyperlipidemia, unspecified: Secondary | ICD-10-CM | POA: Diagnosis not present

## 2021-02-18 DIAGNOSIS — I10 Essential (primary) hypertension: Secondary | ICD-10-CM | POA: Diagnosis not present

## 2021-02-18 DIAGNOSIS — E1169 Type 2 diabetes mellitus with other specified complication: Secondary | ICD-10-CM | POA: Diagnosis not present

## 2021-02-20 DIAGNOSIS — Z9181 History of falling: Secondary | ICD-10-CM | POA: Diagnosis not present

## 2021-02-20 DIAGNOSIS — M25561 Pain in right knee: Secondary | ICD-10-CM | POA: Diagnosis not present

## 2021-02-20 DIAGNOSIS — F0390 Unspecified dementia without behavioral disturbance: Secondary | ICD-10-CM | POA: Diagnosis not present

## 2021-02-20 DIAGNOSIS — Z87891 Personal history of nicotine dependence: Secondary | ICD-10-CM | POA: Diagnosis not present

## 2021-02-20 DIAGNOSIS — R634 Abnormal weight loss: Secondary | ICD-10-CM | POA: Diagnosis not present

## 2021-02-20 DIAGNOSIS — E119 Type 2 diabetes mellitus without complications: Secondary | ICD-10-CM | POA: Diagnosis not present

## 2021-02-20 DIAGNOSIS — I10 Essential (primary) hypertension: Secondary | ICD-10-CM | POA: Diagnosis not present

## 2021-02-20 DIAGNOSIS — M25562 Pain in left knee: Secondary | ICD-10-CM | POA: Diagnosis not present

## 2021-02-20 DIAGNOSIS — Z7984 Long term (current) use of oral hypoglycemic drugs: Secondary | ICD-10-CM | POA: Diagnosis not present

## 2021-02-24 DIAGNOSIS — M25562 Pain in left knee: Secondary | ICD-10-CM | POA: Diagnosis not present

## 2021-02-24 DIAGNOSIS — Z9181 History of falling: Secondary | ICD-10-CM | POA: Diagnosis not present

## 2021-02-24 DIAGNOSIS — R634 Abnormal weight loss: Secondary | ICD-10-CM | POA: Diagnosis not present

## 2021-02-24 DIAGNOSIS — Z87891 Personal history of nicotine dependence: Secondary | ICD-10-CM | POA: Diagnosis not present

## 2021-02-24 DIAGNOSIS — I10 Essential (primary) hypertension: Secondary | ICD-10-CM | POA: Diagnosis not present

## 2021-02-24 DIAGNOSIS — E119 Type 2 diabetes mellitus without complications: Secondary | ICD-10-CM | POA: Diagnosis not present

## 2021-02-24 DIAGNOSIS — Z7984 Long term (current) use of oral hypoglycemic drugs: Secondary | ICD-10-CM | POA: Diagnosis not present

## 2021-02-24 DIAGNOSIS — F0390 Unspecified dementia without behavioral disturbance: Secondary | ICD-10-CM | POA: Diagnosis not present

## 2021-02-24 DIAGNOSIS — M25561 Pain in right knee: Secondary | ICD-10-CM | POA: Diagnosis not present

## 2021-02-25 DIAGNOSIS — M25561 Pain in right knee: Secondary | ICD-10-CM | POA: Diagnosis not present

## 2021-02-25 DIAGNOSIS — Z87891 Personal history of nicotine dependence: Secondary | ICD-10-CM | POA: Diagnosis not present

## 2021-02-25 DIAGNOSIS — Z9181 History of falling: Secondary | ICD-10-CM | POA: Diagnosis not present

## 2021-02-25 DIAGNOSIS — Z7984 Long term (current) use of oral hypoglycemic drugs: Secondary | ICD-10-CM | POA: Diagnosis not present

## 2021-02-25 DIAGNOSIS — R634 Abnormal weight loss: Secondary | ICD-10-CM | POA: Diagnosis not present

## 2021-02-25 DIAGNOSIS — I10 Essential (primary) hypertension: Secondary | ICD-10-CM | POA: Diagnosis not present

## 2021-02-25 DIAGNOSIS — F0390 Unspecified dementia without behavioral disturbance: Secondary | ICD-10-CM | POA: Diagnosis not present

## 2021-02-25 DIAGNOSIS — M25562 Pain in left knee: Secondary | ICD-10-CM | POA: Diagnosis not present

## 2021-02-25 DIAGNOSIS — E119 Type 2 diabetes mellitus without complications: Secondary | ICD-10-CM | POA: Diagnosis not present

## 2021-02-26 DIAGNOSIS — I10 Essential (primary) hypertension: Secondary | ICD-10-CM | POA: Diagnosis not present

## 2021-02-26 DIAGNOSIS — R634 Abnormal weight loss: Secondary | ICD-10-CM | POA: Diagnosis not present

## 2021-02-26 DIAGNOSIS — F0390 Unspecified dementia without behavioral disturbance: Secondary | ICD-10-CM | POA: Diagnosis not present

## 2021-02-26 DIAGNOSIS — Z87891 Personal history of nicotine dependence: Secondary | ICD-10-CM | POA: Diagnosis not present

## 2021-02-26 DIAGNOSIS — M25561 Pain in right knee: Secondary | ICD-10-CM | POA: Diagnosis not present

## 2021-02-26 DIAGNOSIS — Z9181 History of falling: Secondary | ICD-10-CM | POA: Diagnosis not present

## 2021-02-26 DIAGNOSIS — Z7984 Long term (current) use of oral hypoglycemic drugs: Secondary | ICD-10-CM | POA: Diagnosis not present

## 2021-02-26 DIAGNOSIS — M25562 Pain in left knee: Secondary | ICD-10-CM | POA: Diagnosis not present

## 2021-02-26 DIAGNOSIS — E119 Type 2 diabetes mellitus without complications: Secondary | ICD-10-CM | POA: Diagnosis not present

## 2021-03-04 DIAGNOSIS — I10 Essential (primary) hypertension: Secondary | ICD-10-CM | POA: Diagnosis not present

## 2021-03-04 DIAGNOSIS — E782 Mixed hyperlipidemia: Secondary | ICD-10-CM | POA: Diagnosis not present

## 2021-03-04 DIAGNOSIS — Z0000 Encounter for general adult medical examination without abnormal findings: Secondary | ICD-10-CM | POA: Diagnosis not present

## 2021-03-04 DIAGNOSIS — Z91138 Patient's unintentional underdosing of medication regimen for other reason: Secondary | ICD-10-CM | POA: Diagnosis not present

## 2021-03-04 DIAGNOSIS — E1169 Type 2 diabetes mellitus with other specified complication: Secondary | ICD-10-CM | POA: Diagnosis not present

## 2021-03-11 DIAGNOSIS — E119 Type 2 diabetes mellitus without complications: Secondary | ICD-10-CM | POA: Diagnosis not present

## 2021-03-11 DIAGNOSIS — I10 Essential (primary) hypertension: Secondary | ICD-10-CM | POA: Diagnosis not present

## 2021-03-11 DIAGNOSIS — Z7984 Long term (current) use of oral hypoglycemic drugs: Secondary | ICD-10-CM | POA: Diagnosis not present

## 2021-03-11 DIAGNOSIS — R634 Abnormal weight loss: Secondary | ICD-10-CM | POA: Diagnosis not present

## 2021-03-11 DIAGNOSIS — Z9181 History of falling: Secondary | ICD-10-CM | POA: Diagnosis not present

## 2021-03-11 DIAGNOSIS — F0390 Unspecified dementia without behavioral disturbance: Secondary | ICD-10-CM | POA: Diagnosis not present

## 2021-03-11 DIAGNOSIS — Z87891 Personal history of nicotine dependence: Secondary | ICD-10-CM | POA: Diagnosis not present

## 2021-03-11 DIAGNOSIS — M25562 Pain in left knee: Secondary | ICD-10-CM | POA: Diagnosis not present

## 2021-03-11 DIAGNOSIS — M25561 Pain in right knee: Secondary | ICD-10-CM | POA: Diagnosis not present

## 2021-04-15 DIAGNOSIS — F3341 Major depressive disorder, recurrent, in partial remission: Secondary | ICD-10-CM | POA: Diagnosis not present

## 2021-04-15 DIAGNOSIS — I10 Essential (primary) hypertension: Secondary | ICD-10-CM | POA: Diagnosis not present

## 2021-04-15 DIAGNOSIS — E118 Type 2 diabetes mellitus with unspecified complications: Secondary | ICD-10-CM | POA: Diagnosis not present

## 2021-04-15 DIAGNOSIS — E782 Mixed hyperlipidemia: Secondary | ICD-10-CM | POA: Diagnosis not present

## 2021-04-22 DIAGNOSIS — Z1211 Encounter for screening for malignant neoplasm of colon: Secondary | ICD-10-CM | POA: Diagnosis not present

## 2021-04-22 DIAGNOSIS — Z1212 Encounter for screening for malignant neoplasm of rectum: Secondary | ICD-10-CM | POA: Diagnosis not present

## 2021-04-27 LAB — EXTERNAL GENERIC LAB PROCEDURE: COLOGUARD: NEGATIVE

## 2021-05-15 DIAGNOSIS — F3341 Major depressive disorder, recurrent, in partial remission: Secondary | ICD-10-CM | POA: Diagnosis not present

## 2021-05-15 DIAGNOSIS — E785 Hyperlipidemia, unspecified: Secondary | ICD-10-CM | POA: Diagnosis not present

## 2021-05-15 DIAGNOSIS — E1169 Type 2 diabetes mellitus with other specified complication: Secondary | ICD-10-CM | POA: Diagnosis not present

## 2021-05-15 DIAGNOSIS — I10 Essential (primary) hypertension: Secondary | ICD-10-CM | POA: Diagnosis not present

## 2021-05-15 DIAGNOSIS — E782 Mixed hyperlipidemia: Secondary | ICD-10-CM | POA: Diagnosis not present

## 2021-05-29 IMAGING — DX DG SACRUM/COCCYX 2+V
3 series · 3 of 3 positions shown · non-contrast
Comparison: None.

CLINICAL DATA: Pain following fall

EXAM:
SACRUM AND COCCYX - 2+ VIEW

[coccyx ap]
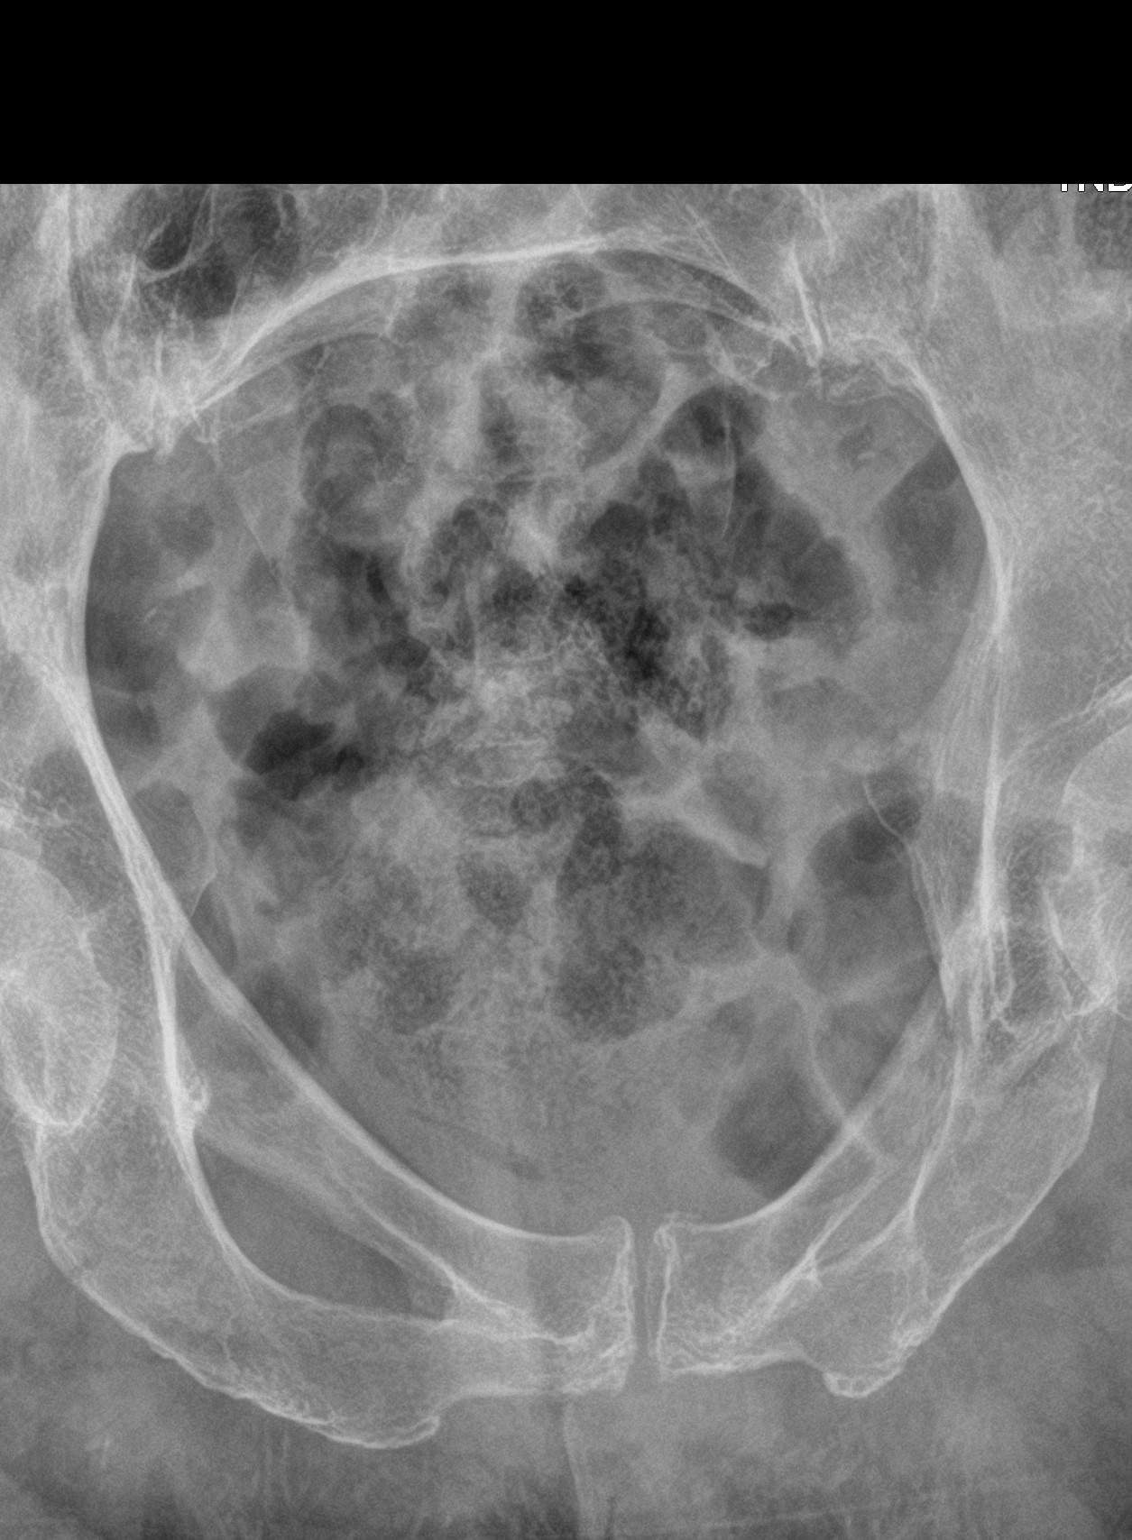

[sacrum ap]
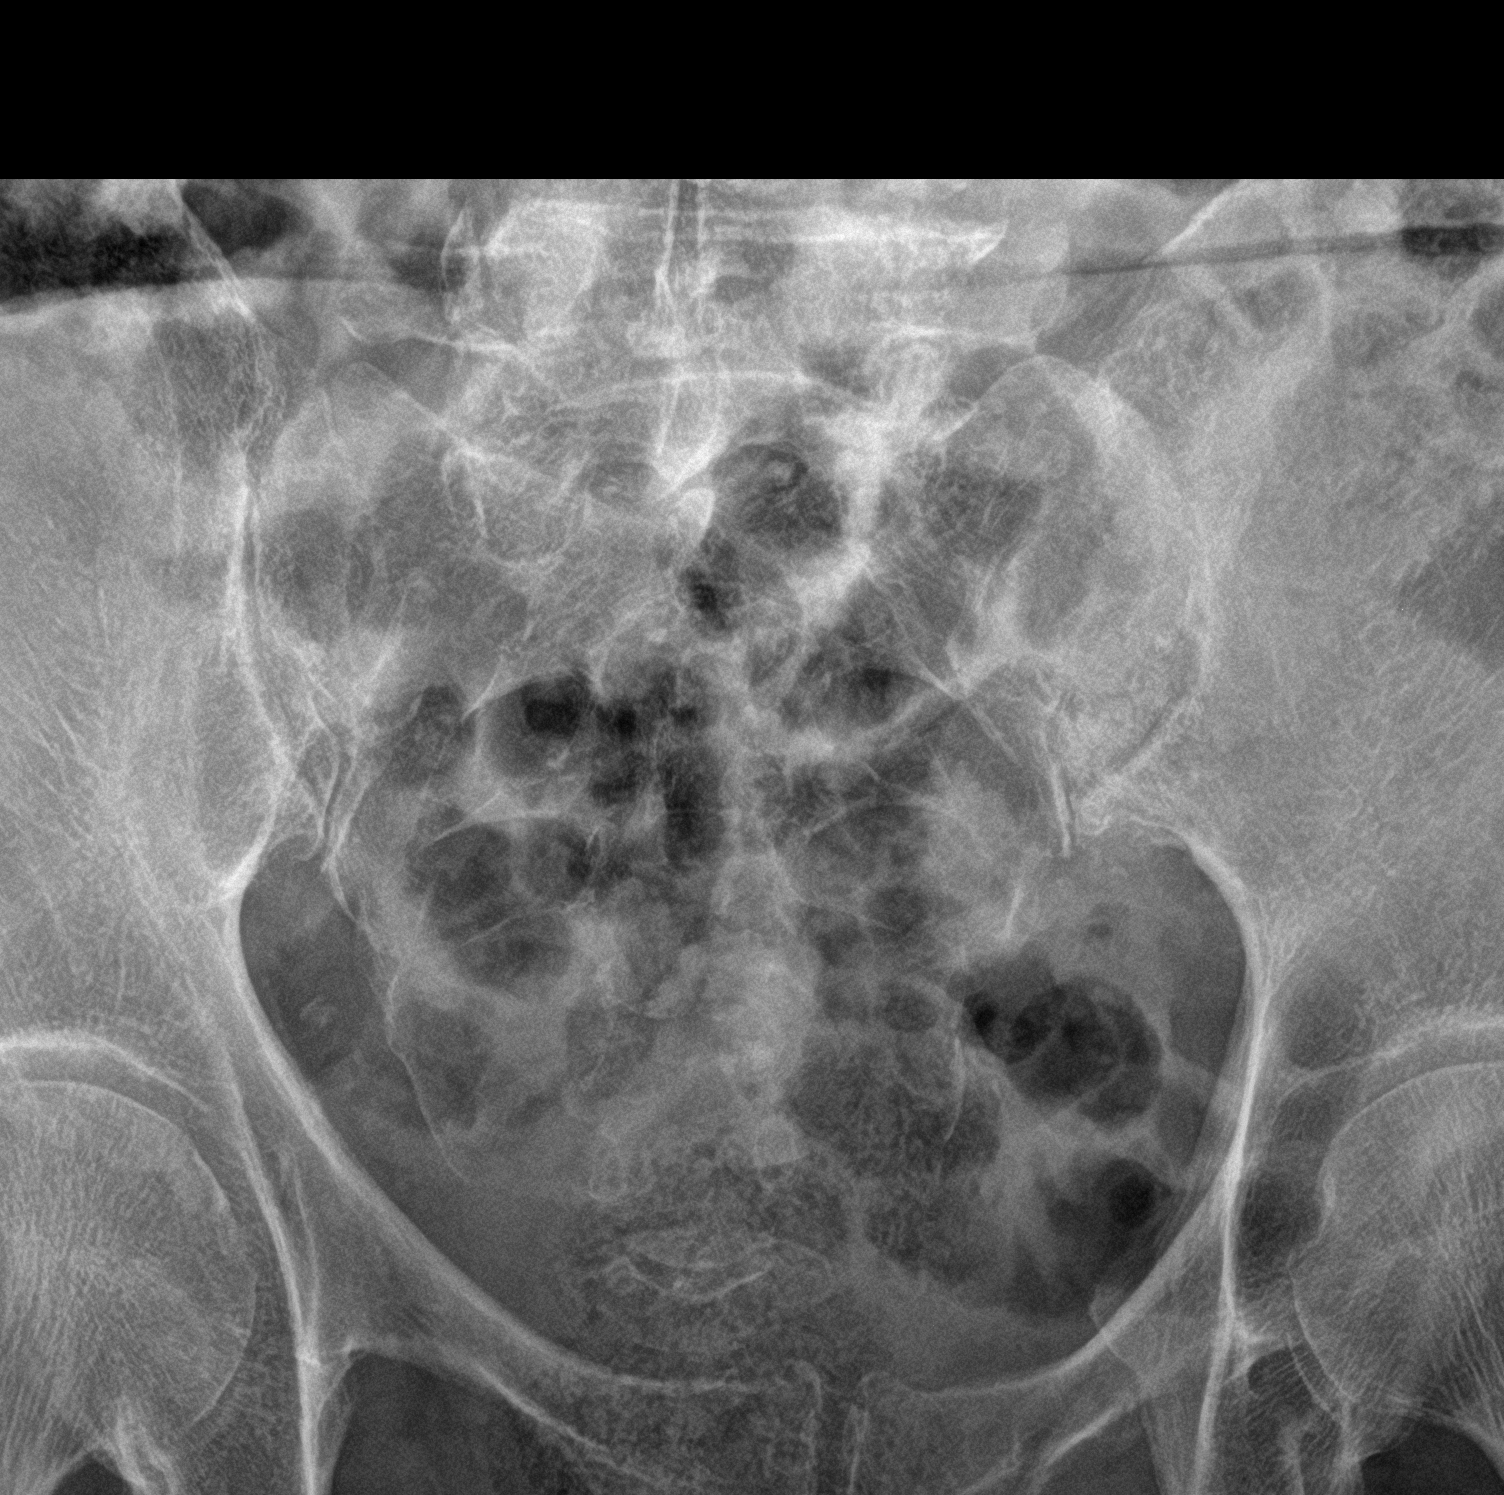

[sacrum lat]
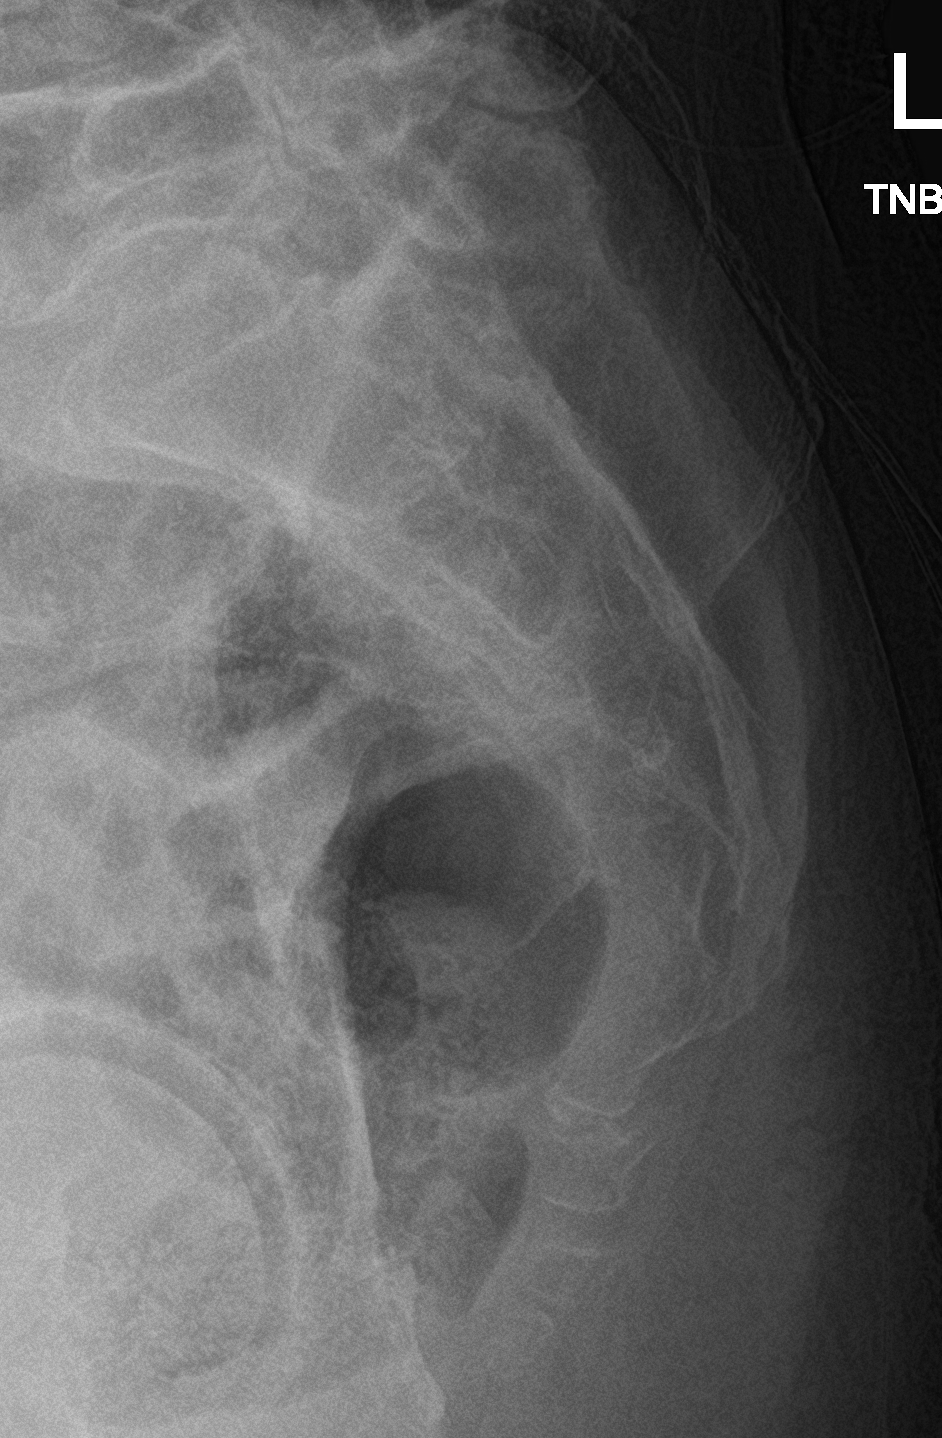

[3 of 3 positions shown; findings below may reference images not displayed]

FINDINGS: Frontal, angled frontal, and lateral views were obtained. There is a
transverse lucency in the mid left sacral ala, concerning for
fracture in this area. No other findings suggesting fracture. No
diastasis. The joint spaces appear normal.
IMPRESSION: Area of lucency in the mid sacrum on the left, concerning for
fracture. No other findings suggesting fracture. No diastasis. No
appreciable arthropathy.

These results will be called to the ordering clinician or
representative by the Radiologist Assistant, and communication
documented in the PACS or [REDACTED].

## 2021-06-26 DIAGNOSIS — I10 Essential (primary) hypertension: Secondary | ICD-10-CM | POA: Diagnosis not present

## 2021-06-26 DIAGNOSIS — E782 Mixed hyperlipidemia: Secondary | ICD-10-CM | POA: Diagnosis not present

## 2021-06-26 DIAGNOSIS — E1169 Type 2 diabetes mellitus with other specified complication: Secondary | ICD-10-CM | POA: Diagnosis not present

## 2021-06-26 DIAGNOSIS — F3341 Major depressive disorder, recurrent, in partial remission: Secondary | ICD-10-CM | POA: Diagnosis not present

## 2021-07-29 DIAGNOSIS — E1169 Type 2 diabetes mellitus with other specified complication: Secondary | ICD-10-CM | POA: Diagnosis not present

## 2021-07-29 DIAGNOSIS — I10 Essential (primary) hypertension: Secondary | ICD-10-CM | POA: Diagnosis not present

## 2021-07-29 DIAGNOSIS — F3341 Major depressive disorder, recurrent, in partial remission: Secondary | ICD-10-CM | POA: Diagnosis not present

## 2021-07-29 DIAGNOSIS — E785 Hyperlipidemia, unspecified: Secondary | ICD-10-CM | POA: Diagnosis not present

## 2021-10-28 DIAGNOSIS — E1169 Type 2 diabetes mellitus with other specified complication: Secondary | ICD-10-CM | POA: Diagnosis not present

## 2021-10-28 DIAGNOSIS — E782 Mixed hyperlipidemia: Secondary | ICD-10-CM | POA: Diagnosis not present

## 2021-10-28 DIAGNOSIS — Z23 Encounter for immunization: Secondary | ICD-10-CM | POA: Diagnosis not present

## 2021-10-28 DIAGNOSIS — R413 Other amnesia: Secondary | ICD-10-CM | POA: Diagnosis not present

## 2022-02-26 ENCOUNTER — Ambulatory Visit
Admission: RE | Admit: 2022-02-26 | Discharge: 2022-02-26 | Disposition: A | Discharge status: Home / Self Care | Payer: Medicare Other | Source: Ambulatory Visit | Attending: Family Medicine | Admitting: Family Medicine

## 2022-02-26 ENCOUNTER — Other Ambulatory Visit: Payer: Self-pay | Admitting: Family Medicine

## 2022-02-26 DIAGNOSIS — M1990 Unspecified osteoarthritis, unspecified site: Secondary | ICD-10-CM | POA: Diagnosis not present

## 2022-02-26 DIAGNOSIS — I10 Essential (primary) hypertension: Secondary | ICD-10-CM | POA: Diagnosis not present

## 2022-02-26 DIAGNOSIS — M25561 Pain in right knee: Secondary | ICD-10-CM | POA: Diagnosis not present

## 2022-02-26 DIAGNOSIS — R52 Pain, unspecified: Secondary | ICD-10-CM

## 2022-02-26 DIAGNOSIS — Z6824 Body mass index (BMI) 24.0-24.9, adult: Secondary | ICD-10-CM | POA: Diagnosis not present

## 2022-02-26 DIAGNOSIS — R413 Other amnesia: Secondary | ICD-10-CM | POA: Diagnosis not present

## 2022-02-26 DIAGNOSIS — E782 Mixed hyperlipidemia: Secondary | ICD-10-CM | POA: Diagnosis not present

## 2022-02-26 DIAGNOSIS — M25562 Pain in left knee: Secondary | ICD-10-CM | POA: Diagnosis not present

## 2022-02-26 DIAGNOSIS — E1169 Type 2 diabetes mellitus with other specified complication: Secondary | ICD-10-CM | POA: Diagnosis not present

## 2022-02-26 DIAGNOSIS — F3341 Major depressive disorder, recurrent, in partial remission: Secondary | ICD-10-CM | POA: Diagnosis not present

## 2022-02-26 DIAGNOSIS — E785 Hyperlipidemia, unspecified: Secondary | ICD-10-CM | POA: Diagnosis not present

## 2022-03-13 DIAGNOSIS — E782 Mixed hyperlipidemia: Secondary | ICD-10-CM | POA: Diagnosis not present

## 2022-03-13 DIAGNOSIS — M1990 Unspecified osteoarthritis, unspecified site: Secondary | ICD-10-CM | POA: Diagnosis not present

## 2022-03-13 DIAGNOSIS — I10 Essential (primary) hypertension: Secondary | ICD-10-CM | POA: Diagnosis not present

## 2022-07-13 DIAGNOSIS — R413 Other amnesia: Secondary | ICD-10-CM | POA: Diagnosis not present

## 2022-07-13 DIAGNOSIS — E785 Hyperlipidemia, unspecified: Secondary | ICD-10-CM | POA: Diagnosis not present

## 2022-07-13 DIAGNOSIS — Z0000 Encounter for general adult medical examination without abnormal findings: Secondary | ICD-10-CM | POA: Diagnosis not present

## 2022-07-13 DIAGNOSIS — E782 Mixed hyperlipidemia: Secondary | ICD-10-CM | POA: Diagnosis not present

## 2022-07-13 DIAGNOSIS — E559 Vitamin D deficiency, unspecified: Secondary | ICD-10-CM | POA: Diagnosis not present

## 2022-07-13 DIAGNOSIS — I10 Essential (primary) hypertension: Secondary | ICD-10-CM | POA: Diagnosis not present

## 2022-07-13 DIAGNOSIS — E1169 Type 2 diabetes mellitus with other specified complication: Secondary | ICD-10-CM | POA: Diagnosis not present

## 2022-09-23 DIAGNOSIS — I10 Essential (primary) hypertension: Secondary | ICD-10-CM | POA: Diagnosis not present

## 2022-09-23 DIAGNOSIS — Z9181 History of falling: Secondary | ICD-10-CM | POA: Diagnosis not present

## 2022-11-27 DIAGNOSIS — E785 Hyperlipidemia, unspecified: Secondary | ICD-10-CM | POA: Diagnosis not present

## 2022-11-27 DIAGNOSIS — I10 Essential (primary) hypertension: Secondary | ICD-10-CM | POA: Diagnosis not present

## 2022-11-27 DIAGNOSIS — E559 Vitamin D deficiency, unspecified: Secondary | ICD-10-CM | POA: Diagnosis not present

## 2022-11-27 DIAGNOSIS — Z6820 Body mass index (BMI) 20.0-20.9, adult: Secondary | ICD-10-CM | POA: Diagnosis not present

## 2022-11-27 DIAGNOSIS — E1169 Type 2 diabetes mellitus with other specified complication: Secondary | ICD-10-CM | POA: Diagnosis not present

## 2023-02-26 DIAGNOSIS — I10 Essential (primary) hypertension: Secondary | ICD-10-CM | POA: Diagnosis not present

## 2023-02-26 DIAGNOSIS — R634 Abnormal weight loss: Secondary | ICD-10-CM | POA: Diagnosis not present

## 2023-02-26 DIAGNOSIS — E785 Hyperlipidemia, unspecified: Secondary | ICD-10-CM | POA: Diagnosis not present

## 2023-02-26 DIAGNOSIS — E782 Mixed hyperlipidemia: Secondary | ICD-10-CM | POA: Diagnosis not present

## 2023-02-26 DIAGNOSIS — E1169 Type 2 diabetes mellitus with other specified complication: Secondary | ICD-10-CM | POA: Diagnosis not present

## 2023-06-24 DIAGNOSIS — E785 Hyperlipidemia, unspecified: Secondary | ICD-10-CM | POA: Diagnosis not present

## 2023-06-24 DIAGNOSIS — E1169 Type 2 diabetes mellitus with other specified complication: Secondary | ICD-10-CM | POA: Diagnosis not present

## 2023-06-24 DIAGNOSIS — I10 Essential (primary) hypertension: Secondary | ICD-10-CM | POA: Diagnosis not present

## 2023-06-25 DIAGNOSIS — I10 Essential (primary) hypertension: Secondary | ICD-10-CM | POA: Diagnosis not present

## 2023-06-25 DIAGNOSIS — E1169 Type 2 diabetes mellitus with other specified complication: Secondary | ICD-10-CM | POA: Diagnosis not present

## 2023-06-25 DIAGNOSIS — Z6822 Body mass index (BMI) 22.0-22.9, adult: Secondary | ICD-10-CM | POA: Diagnosis not present

## 2023-09-01 ENCOUNTER — Other Ambulatory Visit: Payer: Self-pay

## 2023-09-01 ENCOUNTER — Inpatient Hospital Stay (HOSPITAL_COMMUNITY)
Admission: EM | Admit: 2023-09-01 | Discharge: 2023-09-07 | DRG: 071 | Disposition: A | Discharge status: Hospice | Payer: Medicare PPO | Attending: Family Medicine | Admitting: Family Medicine

## 2023-09-01 ENCOUNTER — Emergency Department (HOSPITAL_COMMUNITY): Payer: Medicare PPO

## 2023-09-01 ENCOUNTER — Encounter (HOSPITAL_COMMUNITY): Payer: Self-pay

## 2023-09-01 ENCOUNTER — Inpatient Hospital Stay (HOSPITAL_COMMUNITY): Payer: Medicare PPO

## 2023-09-01 DIAGNOSIS — I680 Cerebral amyloid angiopathy: Secondary | ICD-10-CM | POA: Diagnosis present

## 2023-09-01 DIAGNOSIS — E119 Type 2 diabetes mellitus without complications: Secondary | ICD-10-CM | POA: Diagnosis present

## 2023-09-01 DIAGNOSIS — F32A Depression, unspecified: Secondary | ICD-10-CM | POA: Diagnosis present

## 2023-09-01 DIAGNOSIS — R262 Difficulty in walking, not elsewhere classified: Secondary | ICD-10-CM | POA: Diagnosis present

## 2023-09-01 DIAGNOSIS — R569 Unspecified convulsions: Secondary | ICD-10-CM | POA: Diagnosis not present

## 2023-09-01 DIAGNOSIS — R1310 Dysphagia, unspecified: Secondary | ICD-10-CM | POA: Diagnosis present

## 2023-09-01 DIAGNOSIS — Z8249 Family history of ischemic heart disease and other diseases of the circulatory system: Secondary | ICD-10-CM | POA: Diagnosis not present

## 2023-09-01 DIAGNOSIS — G9349 Other encephalopathy: Secondary | ICD-10-CM | POA: Diagnosis present

## 2023-09-01 DIAGNOSIS — R64 Cachexia: Secondary | ICD-10-CM | POA: Diagnosis present

## 2023-09-01 DIAGNOSIS — Z880 Allergy status to penicillin: Secondary | ICD-10-CM | POA: Diagnosis not present

## 2023-09-01 DIAGNOSIS — R221 Localized swelling, mass and lump, neck: Secondary | ICD-10-CM | POA: Diagnosis not present

## 2023-09-01 DIAGNOSIS — E639 Nutritional deficiency, unspecified: Secondary | ICD-10-CM

## 2023-09-01 DIAGNOSIS — R29721 NIHSS score 21: Secondary | ICD-10-CM | POA: Diagnosis not present

## 2023-09-01 DIAGNOSIS — R220 Localized swelling, mass and lump, head: Secondary | ICD-10-CM | POA: Diagnosis not present

## 2023-09-01 DIAGNOSIS — Z515 Encounter for palliative care: Secondary | ICD-10-CM | POA: Diagnosis not present

## 2023-09-01 DIAGNOSIS — R4701 Aphasia: Secondary | ICD-10-CM | POA: Diagnosis not present

## 2023-09-01 DIAGNOSIS — Z87891 Personal history of nicotine dependence: Secondary | ICD-10-CM

## 2023-09-01 DIAGNOSIS — Z91013 Allergy to seafood: Secondary | ICD-10-CM

## 2023-09-01 DIAGNOSIS — Z66 Do not resuscitate: Secondary | ICD-10-CM | POA: Diagnosis not present

## 2023-09-01 DIAGNOSIS — J45909 Unspecified asthma, uncomplicated: Secondary | ICD-10-CM | POA: Diagnosis present

## 2023-09-01 DIAGNOSIS — E079 Disorder of thyroid, unspecified: Secondary | ICD-10-CM | POA: Diagnosis present

## 2023-09-01 DIAGNOSIS — E854 Organ-limited amyloidosis: Secondary | ICD-10-CM | POA: Diagnosis present

## 2023-09-01 DIAGNOSIS — E440 Moderate protein-calorie malnutrition: Secondary | ICD-10-CM | POA: Diagnosis not present

## 2023-09-01 DIAGNOSIS — Z79899 Other long term (current) drug therapy: Secondary | ICD-10-CM | POA: Diagnosis not present

## 2023-09-01 DIAGNOSIS — I10 Essential (primary) hypertension: Secondary | ICD-10-CM | POA: Insufficient documentation

## 2023-09-01 DIAGNOSIS — Z681 Body mass index (BMI) 19 or less, adult: Secondary | ICD-10-CM

## 2023-09-01 DIAGNOSIS — R4182 Altered mental status, unspecified: Secondary | ICD-10-CM | POA: Diagnosis present

## 2023-09-01 DIAGNOSIS — E041 Nontoxic single thyroid nodule: Secondary | ICD-10-CM | POA: Diagnosis not present

## 2023-09-01 DIAGNOSIS — I4891 Unspecified atrial fibrillation: Secondary | ICD-10-CM | POA: Diagnosis not present

## 2023-09-01 DIAGNOSIS — R9082 White matter disease, unspecified: Secondary | ICD-10-CM | POA: Diagnosis present

## 2023-09-01 DIAGNOSIS — I6381 Other cerebral infarction due to occlusion or stenosis of small artery: Secondary | ICD-10-CM | POA: Diagnosis not present

## 2023-09-01 DIAGNOSIS — G40011 Localization-related (focal) (partial) idiopathic epilepsy and epileptic syndromes with seizures of localized onset, intractable, with status epilepticus: Secondary | ICD-10-CM | POA: Diagnosis not present

## 2023-09-01 DIAGNOSIS — R001 Bradycardia, unspecified: Secondary | ICD-10-CM | POA: Diagnosis present

## 2023-09-01 DIAGNOSIS — E43 Unspecified severe protein-calorie malnutrition: Secondary | ICD-10-CM | POA: Diagnosis present

## 2023-09-01 DIAGNOSIS — I6783 Posterior reversible encephalopathy syndrome: Secondary | ICD-10-CM | POA: Diagnosis not present

## 2023-09-01 DIAGNOSIS — Z7189 Other specified counseling: Secondary | ICD-10-CM | POA: Diagnosis not present

## 2023-09-01 DIAGNOSIS — E785 Hyperlipidemia, unspecified: Secondary | ICD-10-CM | POA: Diagnosis present

## 2023-09-01 DIAGNOSIS — I6502 Occlusion and stenosis of left vertebral artery: Secondary | ICD-10-CM | POA: Diagnosis not present

## 2023-09-01 DIAGNOSIS — R0989 Other specified symptoms and signs involving the circulatory and respiratory systems: Secondary | ICD-10-CM | POA: Diagnosis not present

## 2023-09-01 DIAGNOSIS — R531 Weakness: Secondary | ICD-10-CM | POA: Diagnosis not present

## 2023-09-01 DIAGNOSIS — F0393 Unspecified dementia, unspecified severity, with mood disturbance: Secondary | ICD-10-CM | POA: Diagnosis present

## 2023-09-01 DIAGNOSIS — I6529 Occlusion and stenosis of unspecified carotid artery: Secondary | ICD-10-CM | POA: Diagnosis not present

## 2023-09-01 DIAGNOSIS — R410 Disorientation, unspecified: Secondary | ICD-10-CM | POA: Diagnosis not present

## 2023-09-01 DIAGNOSIS — I4510 Unspecified right bundle-branch block: Secondary | ICD-10-CM | POA: Diagnosis not present

## 2023-09-01 DIAGNOSIS — J9811 Atelectasis: Secondary | ICD-10-CM | POA: Diagnosis not present

## 2023-09-01 DIAGNOSIS — G4489 Other headache syndrome: Secondary | ICD-10-CM | POA: Diagnosis not present

## 2023-09-01 DIAGNOSIS — Z7984 Long term (current) use of oral hypoglycemic drugs: Secondary | ICD-10-CM

## 2023-09-01 DIAGNOSIS — Z743 Need for continuous supervision: Secondary | ICD-10-CM | POA: Diagnosis not present

## 2023-09-01 LAB — COMPREHENSIVE METABOLIC PANEL
ALT: 15 U/L (ref 0–44)
AST: 24 U/L (ref 15–41)
Albumin: 3.7 g/dL (ref 3.5–5.0)
Alkaline Phosphatase: 37 U/L — ABNORMAL LOW (ref 38–126)
Anion gap: 15 (ref 5–15)
BUN: 22 mg/dL (ref 8–23)
CO2: 27 mmol/L (ref 22–32)
Calcium: 10 mg/dL (ref 8.9–10.3)
Chloride: 97 mmol/L — ABNORMAL LOW (ref 98–111)
Creatinine, Ser: 0.99 mg/dL (ref 0.44–1.00)
GFR, Estimated: 56 mL/min — ABNORMAL LOW (ref >60)
Glucose, Bld: 96 mg/dL (ref 70–99)
Potassium: 4.6 mmol/L (ref 3.5–5.1)
Sodium: 139 mmol/L (ref 135–145)
Total Bilirubin: 0.8 mg/dL (ref 0.3–1.2)
Total Protein: 6.9 g/dL (ref 6.5–8.1)

## 2023-09-01 LAB — URINALYSIS, ROUTINE W REFLEX MICROSCOPIC
Bilirubin Urine: NEGATIVE
Glucose, UA: NEGATIVE mg/dL
Hgb urine dipstick: NEGATIVE
Ketones, ur: 5 mg/dL — AB
Leukocytes,Ua: NEGATIVE
Nitrite: NEGATIVE
Protein, ur: 30 mg/dL — AB
Specific Gravity, Urine: 1.025 (ref 1.005–1.030)
pH: 5 (ref 5.0–8.0)

## 2023-09-01 LAB — PROTIME-INR
INR: 1 (ref 0.8–1.2)
Prothrombin Time: 13 s (ref 11.4–15.2)

## 2023-09-01 LAB — CBC WITH DIFFERENTIAL/PLATELET
Abs Immature Granulocytes: 0.02 10*3/uL (ref 0.00–0.07)
Basophils Absolute: 0 10*3/uL (ref 0.0–0.1)
Basophils Relative: 1 %
Eosinophils Absolute: 0.1 10*3/uL (ref 0.0–0.5)
Eosinophils Relative: 1 %
HCT: 37.4 % (ref 36.0–46.0)
Hemoglobin: 12 g/dL (ref 12.0–15.0)
Immature Granulocytes: 0 %
Lymphocytes Relative: 17 %
Lymphs Abs: 0.8 10*3/uL (ref 0.7–4.0)
MCH: 30.1 pg (ref 26.0–34.0)
MCHC: 32.1 g/dL (ref 30.0–36.0)
MCV: 93.7 fL (ref 80.0–100.0)
Monocytes Absolute: 0.5 10*3/uL (ref 0.1–1.0)
Monocytes Relative: 10 %
Neutro Abs: 3.3 10*3/uL (ref 1.7–7.7)
Neutrophils Relative %: 71 %
Platelets: 252 10*3/uL (ref 150–400)
RBC: 3.99 MIL/uL (ref 3.87–5.11)
RDW: 13.2 % (ref 11.5–15.5)
WBC: 4.7 10*3/uL (ref 4.0–10.5)
nRBC: 0 % (ref 0.0–0.2)

## 2023-09-01 LAB — APTT: aPTT: 26 s (ref 24–36)

## 2023-09-01 LAB — MAGNESIUM: Magnesium: 2.2 mg/dL (ref 1.7–2.4)

## 2023-09-01 LAB — CBG MONITORING, ED: Glucose-Capillary: 85 mg/dL (ref 70–99)

## 2023-09-01 LAB — TSH: TSH: 1.697 u[IU]/mL (ref 0.350–4.500)

## 2023-09-01 LAB — AMMONIA: Ammonia: 14 umol/L (ref 9–35)

## 2023-09-01 MED ORDER — LEVETIRACETAM IN NACL 1000 MG/100ML IV SOLN
INTRAVENOUS | Status: AC
  Filled 2023-09-01: qty 100

## 2023-09-01 MED ORDER — ENOXAPARIN SODIUM 30 MG/0.3ML IJ SOSY
30.0000 mg | PREFILLED_SYRINGE | INTRAMUSCULAR | Status: DC
  Administered 2023-09-01: 30 mg via SUBCUTANEOUS
  Filled 2023-09-01: qty 0.3

## 2023-09-01 MED ORDER — SODIUM CHLORIDE 0.9 % IV SOLN
200.0000 mg | Freq: Once | INTRAVENOUS | Status: AC
  Administered 2023-09-01: 200 mg via INTRAVENOUS
  Filled 2023-09-01: qty 20

## 2023-09-01 MED ORDER — LEVETIRACETAM IN NACL 1000 MG/100ML IV SOLN
1000.0000 mg | Freq: Once | INTRAVENOUS | Status: AC
  Administered 2023-09-01: 1000 mg via INTRAVENOUS

## 2023-09-01 MED ORDER — IOHEXOL 350 MG/ML SOLN
75.0000 mL | Freq: Once | INTRAVENOUS | Status: AC | PRN
  Administered 2023-09-01: 75 mL via INTRAVENOUS

## 2023-09-01 NOTE — H&P (Cosign Needed)
Hospital Admission History and Physical Service Pager: [phone]  Patient name: Dawn [name] Medical record number: [id number] Date of Birth: 11/30/1934 Age: 88 y.o. Gender: female  Primary Care Provider: Billee Cashing, MD Consultants: Neurology Code Status: FULL which was confirmed with son Preferred Emergency Contact:  Extended Emergency Contact Information Primary Emergency Contact: Rosenstock,GREGORY Home Phone: [phone] Mobile Phone: [phone] Relation: Son Secondary Emergency Contact: Rossitto,CYNTHIA Mobile Phone: [phone] Relation: Daughter   Chief Complaint: AMS  Assessment and Plan: Dawn [name] is a 88 y.o. female presenting with AMS, LKN 1pm this afternoon . Differential for this patient's presentation of this includes stroke, seizure, mechanical fall with head trauma, ruptured aneurysm, metabolic derangement, infectious etiology, medication misadventure.   Strongest suspicion for stroke or seizure. CT head is negative for intracerebral hemorrhage/stroke, but MRI ordered to further workup. Longterm EEG in process per neurology to assess for seizure activity.   Possible medication misadventure as patient has history of dementia and was not monitored at the time of AMS onset. She also has not been consistently receiving her medications for the last several months, which may have contributed. Ethanol and UDS pending for further information.  Less likely to be ruptured aneurysm or head trauma based on imaging in ED: CT head with age indeterminate infarct of basal ganglia as well as hypoattenuation within the white matter of the frontoparietal lobes suspicious for chronic ischemic change versus frank ischemia or mass. Additionally, CTA without evidence of LVO, though there is noted to be a large 8.7cm mass appearing to arise from the left thyroid, raising concern for malignancy, though likely not the cause of the patient's current presentation.  Labs  show WBCs WNL, patient afebrile, no recent sick symptoms per family, so much less likely to be caused by infectious etiology. Family does note more frequent urination lately, however UA without leukocytes, nitrites; unlikely UTI. CMP is grossly normal without derangement of sodium or potassium; unlikely related to electrolyte derangement.   Assessment & Plan Altered mental status, unspecified altered mental status type Family with concern for dementia, however this episode represents a dramatic and rapid change from patient's baseline.  Suspicious for possible seizure or stroke, medication misadventure; workup to continue as below.  CT with evidence of age-indeterminate infarct in left basal ganglia. - Admit to FMTS, progressive unit, attending Dr. Lum Babe - Vital signs per floor, cardiac monitoring - q4h neuro checks - strict I/Os - PT/OT to treat - AM BMP - UDS, ethanol in process - f/u blood cultures - MRI Brain ordered Focal seizure (HCC) Loaded with Keppra 1000 mg, Vimpat 200 mg.  LTM EEG in progress. - neuro following, appreciate recommendations -Per neuro avoid benzodiazepines at this time - f/u LTM EEG - seizure precautions - will reach out to neuro for BP goals Poor nutrition BMI 16.  Family concern for dementia, possible poor oral intake and nutrition status.  Patient is very thin. 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 TSH 1.697, WNL. - [place] ordered for further eval   Chronic and Stable Conditions: *Unclear if patient was taking any medications at home recently.  All home meds held at this time in light of this, with other relevant reasons listed below. T2DM - holding glipizide, metformin due to AMS/NPO currently;  HTN - holding amlodipine, fosinopril, diovan-HCTZ due to AMS/NPO currently Asthma - holding albuterol  FEN/GI: NPO pending swallow eval VTE Prophylaxis: Lovenox  Disposition:  Progressive  History of Present Illness:  Dawn [name] is a 88 y.o. female presenting from home via EMS with AMS. She was last seen to be well earlier this afternoon by her son, see below.  Called son Dawn [name] for collateral. Today he says he left the house to run errands for about an hour and a half; when he left patient was in normal state of health and mentation, she said goodbye to him at the door. When he returned home he states patient was seated at the kitchen table. She stood up and said "I wanna talk to you about something" but then did not speak further. Son noticed she was gripping the table and thought she looked very unsteady on her feet.   Son Earl Lites reports that prior to events of today patient was very independent; she feeds, bathes, clothes herself, walks on her own, holds conversation, toilets on her own. He states she managed her own medications until the last few months when she stopped taking them and would get angry with her son if he tried to give them; he does not think she has been taking her medications since then.   In the ED, patient was worked up with labs and imaging. CXR without acute process to explain presentation. CT head with areas of hypoattenuation. CTA head and neck without LVO, however did incidentally find a large mass appearing to arise from the left thyroid.  Labs were grossly normal; CMP with normal electrolytes and Cr, CBC and PT-INR WNL, TSH WNL, UA without leuks or nitrites. Neurology was consulted to assess patient and they suspected seizure so began LTM EEG.  Review Of Systems: Could not complete due to pt altered  Pertinent Past Medical History: Unable to obtain from pt due to AMS. Son unsure. Per chart review: T2DM Asthma HTN Remainder reviewed in history tab.   Pertinent Past Surgical History: Per chart review: Ex lap and Small bowel resection (2013) Remainder reviewed in history tab.   Pertinent Social History: Unable to  obtain due to pt altered Tobacco use: unknown Alcohol use: unknown Other Substance use: unknown Lives with son  Pertinent Family History: Per chart review: Mother - cancer Father - heart failure Remainder reviewed in history tab.   Important Outpatient Medications: Unable to obtain from pt due to AMS. According to pt's son she has not been taking her medicines for the last several months; he does not know specific medications. Remainder reviewed in medication history.   Objective: BP (!) 196/96   Pulse 73   Temp 98.8 F (37.1 C) (Oral)   Resp 15   Ht 5\' 4"  (1.626 m)   Wt 43.1 kg   SpO2 100%   BMI 16.31 kg/m  General: Lying on her right side, asleep, but opens eyes occasionally to vocal stimulation. Does not speak or respond to questions or prompts. NAD. HEENT: normocephalic, PERRLA, MMM. Cardio: RRR, no murmurs. Intact radial and pedal pulses bilaterally 2+. Pulm: Clear, no wheezing, no crackles. No increased work of breathing. Abdominal: bowel sounds present, soft, non-tender, non-distended. Extremities: no peripheral edema.  Neuro: CN II: PERRLA Right gaze deviation. Does not speak, make eye contact, respond to commands. Is protecting her airway.  Labs:  CBC BMET  Recent Labs  Lab 09/01/23 1731  WBC 4.7  HGB 12.0  HCT 37.4  PLT 252   Recent Labs  Lab 09/01/23 1850  NA 139  K 4.6  CL 97*  CO2 27  BUN 22  CREATININE  0.99  GLUCOSE 96  CALCIUM 10.0     Magnesium 2.2 Ammonia 14 TSH 1.697 Pro time INR WNL aPTT WNL  EKG: regular rate and rhythm, p waves present before each QRS, normal PR interval, left axis deviation, no QTc prolongation, no evidence of ST segment elevation  Imaging Studies Performed: CT head: 1. 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 Recommend MRI for further evaluation. Recommend with contrast exam given impression #2. 2. Somewhat conspicuous hypoattenuation within the white matter of the right greater than left  frontoparietal lobes with possible extension to involve overlying right parietal cortex. While this could be in part due to chronic microvascular ischemic change, recommend MRI with contrast to exclude other etiologies including ischemia or a mass. CXR: Low lung volumes with atelectasis at the lung bases.  CTA head and neck: 1. No emergent large vessel occlusion. 2. Moderate left vertebral artery origin stenosis. 3. 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 While indeterminate, findings raise concern for thyroid cancer. Recommend ultrasound for further characterization and consideration of tissue sampling (ref: J Am Coll Radiol. 2015 Feb;12(2): 143-50). Additionally, the inferior extent of this mass is incompletely characterized and CT of the chest with contrast may be useful to fully evaluate.  Cyndia Skeeters, DO 09/01/2023, 11:25 PM PGY-1, [facility]  FPTS Intern pager: [phone], text pages welcome Secure chat group [facility] Teaching Service   I was personally present and performed or re-performed the history, physical exam and medical decision making activities of this service and have verified that the service and findings are accurately documented in the resident's note.  Shelby Mattocks, DO                  09/02/2023, 1:30 AM

## 2023-09-01 NOTE — Progress Notes (Signed)
LTM EEG hooked up and running - no initial skin breakdown - push button tested -  MRI compatible leads Patient is not monitored by [facility] while in ER

## 2023-09-01 NOTE — ED Triage Notes (Signed)
BIBM from home d/t worsening confusion, weakness, and frequent urination per family member. Hx dementia at baseline.

## 2023-09-01 NOTE — Consult Note (Signed)
NEUROLOGY CONSULT NOTE   Date of service: September 01, 2023 Patient Name: Dawn [name] MRN:  [id number] DOB:  Apr 19, 1935 Chief Complaint: "R gaze, aphasia" Requesting Provider: Lonell Grandchild, MD  History of Present Illness  Dawn [name] is a 88 y.o. female with DM2, HTN, dementia, who was at her baseline at 1300. Her sone left to run some errands and returned around 3pm to find her confused. She was sitting on the table and told him she wanted to talk to him and stood up and stiff and confused. Son called EMS and she was brought in to the ED. In the ED, she was noted to have R gaze deviation but spontaneously moving all extremities. She would not talk or follow commands and thus a code stroke was activated.  She had a CT Head which was negative for ICH but notable for extensive white matter disease with ?extension into R parietal cortex ?ischemia vs mass. CTA with no LVO.  In the scanner, noted to have subtle but rhythmic twitching around R eye in addition to R gaze deviation. She is maintaining her wakefullness.  She was given Keppra load of 1000mg  IV once, loading with Vimpat next.   LKW: 1300 Modified rankin score: 3-Moderate disability-requires help but walks WITHOUT assistance IV Thrombolysis: no, low suspicion for stroke and outside window EVT: not offered due to no LVO  NIHSS components Score: Comment  1a Level of Conscious 0[]  1[x]  2[]  3[]      1b LOC Questions 0[]  1[]  2[x]       1c LOC Commands 0[]  1[]  2[x]       2 Best Gaze 0[]  1[]  2[x]       3 Visual 0[]  1[]  2[x]  3[]      4 Facial Palsy 0[x]  1[]  2[]  3[]      5a Motor Arm - left 0[]  1[]  2[x]  3[]  4[]  UN[]    5b Motor Arm - Right 0[]  1[]  2[x]  3[]  4[]  UN[]    6a Motor Leg - Left 0[]  1[]  2[x]  3[]  4[]  UN[]    6b Motor Leg - Right 0[]  1[]  2[x]  3[]  4[]  UN[]    7 Limb Ataxia 0[x]  1[]  2[]  3[]  UN[]     8 Sensory 0[x]  1[]  2[]  UN[]      9 Best Language 0[]  1[]  2[]  3[x]      10 Dysarthria 0[]  1[]  2[x]  UN[]      11 Extinct. and  Inattention 0[x]  1[]  2[]       TOTAL: 21    ROS  Unable to ascertain due to mute.  Past History   Past Medical History:  Diagnosis Date   Asthma    Diabetes mellitus without complication (HCC)    Hypertension     Past Surgical History:  Procedure Laterality Date   BOWEL RESECTION  11/12/2012   Procedure: SMALL BOWEL RESECTION;  Surgeon: Mariella Saa, MD;  Location: [facility] OR;  Service: General;  Laterality: N/A;   LAPAROTOMY  11/12/2012   Procedure: EXPLORATORY LAPAROTOMY;  Surgeon: Mariella Saa, MD;  Location: [facility] OR;  Service: General;  Laterality: N/A;    Family History: Family History  Problem Relation Age of Onset   Cancer Mother    Heart failure Father     Social History  reports that she has quit smoking. She quit smokeless tobacco use about 30 years ago. She reports that she does not drink alcohol and does not use drugs.  Allergies  Allergen Reactions   Penicillins Anaphylaxis    Has patient had a PCN reaction causing immediate rash, facial/tongue/throat  swelling, SOB or lightheadedness with hypotension: Yes Has patient had a PCN reaction causing severe rash involving mucus membranes or skin necrosis: Yes Has patient had a PCN reaction that required hospitalization No Has patient had a PCN reaction occurring within the last 10 years: No If all of the above answers are "NO", then may proceed with Cephalosporin use.   Shellfish Allergy Swelling    Medications   Current Facility-Administered Medications:    lacosamide (VIMPAT) 200 mg in sodium chloride 0.9 % 25 mL IVPB, 200 mg, Intravenous, Once, Erick Blinks, MD   levETIRAcetam (KEPPRA) 1000 MG/100ML IVPB, , , ,   Current Outpatient Medications:    albuterol (PROVENTIL HFA;VENTOLIN HFA) 108 (90 BASE) MCG/ACT inhaler, Inhale 2 puffs into the lungs every 6 (six) hours as needed for wheezing. , Disp: , Rfl:    fosinopril (MONOPRIL) 10 MG tablet, Take 10 mg by mouth daily., Disp: , Rfl:     glipiZIDE (GLUCOTROL) 10 MG tablet, Take 10 mg by mouth daily with breakfast., Disp: , Rfl:    metFORMIN (GLUCOPHAGE-XR) 500 MG 24 hr tablet, Take 500 mg by mouth daily with breakfast. , Disp: , Rfl:    ondansetron (ZOFRAN-ODT) 4 MG disintegrating tablet, Take 1 tablet (4 mg total) by mouth 2 (two) times daily., Disp: 10 tablet, Rfl: 0   traMADol (ULTRAM) 50 MG tablet, Take 1 tablet (50 mg total) by mouth every 6 (six) hours as needed., Disp: 12 tablet, Rfl: 0   triamterene-hydrochlorothiazide (DYAZIDE) 37.5-25 MG per capsule, Take 1 capsule by mouth every morning., Disp: , Rfl:   Vitals   Vitals:   09/01/23 1649 09/01/23 1656 09/01/23 1700 09/01/23 1845  BP: (!) 152/72  (!) 160/106 (!) 190/92  Pulse:   62 72  Resp:   (!) 21 20  Temp:      TempSrc:      SpO2: 100%  99% 100%  Weight:  43.1 kg    Height:  5\' 4"  (1.626 m)      Body mass index is 16.31 kg/m.  Physical Exam   Constitutional: Appears frail and old Psych: Affect appropriate to situation.  Eyes: No scleral injection.  HENT: No OP obstruction.  Head: Normocephalic.  Cardiovascular: Normal rate and regular rhythm.  Respiratory: Effort normal, non-labored breathing.  GI: Soft.  No distension. There is no tenderness.  Skin: WDI.   Neurologic Examination  Mental status/Cognition: awake, social smile. No speech. Does not answer orientation questions. ?made brief eye contact on the right Speech/language: none, mute throughout the encounter, does not follow commands. Cranial nerves:   CN II Pupils equal and reactive to light, does not blink to threat on the left.   CN III,IV,VI R gaze deviation, does not cross midline.   CN V normal sensation in V1, V2, and V3 segments bilaterally   CN VII Symmetric facial grimace.   CN VIII ?made brief eye contact on the right   CN IX & X Protecting her airway   CN XI Head turned to the right   CN XII Tongue midline, does not protrude on command.   Motor:  Muscle bulk: poor, tone  normal Spontaneous and antigravity movement noted in all extremities.  Sensation:  Light touch Localizes to proximal pinch in all extremities.   Pin prick    Temperature    Vibration   Proprioception    Coordination/Complex Motor:  - Unable to assess.  Labs   CBC:  Recent Labs  Lab 09/01/23 1731  WBC 4.7  NEUTROABS 3.3  HGB 12.0  HCT 37.4  MCV 93.7  PLT 252    Basic Metabolic Panel:  Lab Results  Component Value Date   NA 140 02/09/2016   K 4.0 02/09/2016   CO2 28 02/09/2016   GLUCOSE 110 (H) 02/09/2016   BUN 9 02/09/2016   CREATININE 1.04 (H) 02/09/2016   CALCIUM 9.8 02/09/2016   GFRNONAA 50 (L) 02/09/2016   GFRAA 58 (L) 02/09/2016   Lipid Panel: No results found for: "LDLCALC" HgbA1c: No results found for: "HGBA1C" Urine Drug Screen: No results found for: "LABOPIA", "COCAINSCRNUR", "LABBENZ", "AMPHETMU", "THCU", "LABBARB"  Alcohol Level No results found for: "ETH" INR  Lab Results  Component Value Date   INR 1.0 09/01/2023   APTT  Lab Results  Component Value Date   APTT 26 09/01/2023   AED levels: No results found for: "PHENYTOIN", "ZONISAMIDE", "LAMOTRIGINE", "LEVETIRACETA"   CT Head without contrast(Personally reviewed): 1. 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 Recommend MRI for further evaluation. Recommend with contrast exam given impression #2. 2. Somewhat conspicuous hypoattenuation within the white matter of the right greater than left frontoparietal lobes with possible extension to involve overlying right parietal cortex. While this could be in part due to chronic microvascular ischemic change, recommend MRI with contrast to exclude other etiologies including ischemia or a mass.  CT angio Head and Neck with contrast(Personally reviewed): No LVO  MRI Brain: pending  cEEG:  pending  Impression   Dawn [name] is a 88 y.o. female with DM2, HTN, dementia, who presents with subtle seizure with R gaze deviation, R  head turn and aphasia out of proportion to encephalopathy. Noted to have subtle rhythmic twitching around R eye.  Presentation is concerning for focal status epilepticus. Etiology is unclear.  Recommendations  - Keppra load of 1000mg  IV once - Loading with Vimpat 200mg  IV once. - avoiding Benzos since she is not in generalized status and maintaining her wakefullness. - LTM EEG, further Aeds based on LTM EEG. - MRI Brain with and without contrast. - CT Chest with contrast wen able for further evaluation of noted L thyroid necrotic mass. - Patient signed out to Dr. Iver Nestle. ______________________________________________________________________  This patient is critically ill and at significant risk of neurological worsening, death and care requires constant monitoring of vital signs, hemodynamics,respiratory and cardiac monitoring, neurological assessment, discussion with family, other specialists and medical decision making of high complexity. I spent 40 minutes of neurocritical care time  in the care of  this patient. This was time spent independent of any time provided by nurse practitioner or PA.  Erick Blinks [facility] 09/01/2023  7:39 PM  Signed,  Erick Blinks

## 2023-09-01 NOTE — Assessment & Plan Note (Signed)
Family with concern for dementia, however this episode represents a dramatic and rapid change from patient's baseline.  Suspicious for possible seizure or stroke, medication misadventure; workup to continue as below.  CT with evidence of age-indeterminate infarct in left basal ganglia. - Admit to FMTS, progressive unit, attending Dr. Lum Babe - Vital signs per floor, cardiac monitoring - q4h neuro checks - strict I/Os - PT/OT to treat - AM BMP - UDS, ethanol in process - f/u blood cultures - MRI Brain ordered

## 2023-09-01 NOTE — ED Notes (Signed)
Unable to perform MRI as pt vomited w/movement to the mri table

## 2023-09-01 NOTE — Progress Notes (Signed)
Noted MRI delayed due to emesis, no overt concern for aspiration, no further emesis Patient undergoing EEG hookup at this time (was delayed as patient was being transported to MRI earlier)  Pending EEG hookup discussed with nursing taking patient to MRI as clarification of Head CT findings may significantly change management  Patient awake, tracking examiner briefly, remains non-verbal but moving all 4 extremities (essentially stable exam from prior though no clear twitching at this time)  Brooke Dare MD-PhD [facility] [phone] Available 7 AM to 7 PM, outside these hours please contact Neurologist on call listed on AMION   Additional 10 minutes of care provided   Update  12:56 AM  EEG reviewed, concerning for possible focal seizure activity arising at ~T8 To achieve full 60 mg/kg loading dose, will give additional 1.5 g keppra now, and schedule keppra 500 mg BID (max for current renal function)  Also scheduling vimpat 100 mg BID   An additional 10 minutes of care  Update 2:47 AM   MRI brain reviewed; extremely motion limited but concerning for PRES and underlying CAA on my review, formal radiology report pending Updated primary team, blood pressure goal 130 to 150 mmHg/70 to 100 mmHg given last blood pressure 147/79  An additional 10 minutes of care provided

## 2023-09-01 NOTE — ED Provider Notes (Signed)
[place] EMERGENCY DEPARTMENT AT [facility] Provider Note  CSN: [id number] Arrival date & time: 09/01/23 1642  Chief Complaint(s) Altered Mental Status  HPI Dawn [name] is a 88 y.o. female history of dementia, diabetes, hypertension presenting to the emergency department for confusion.  Unclear baseline but apparently patient has been more confused and weak. EMS reports foul smelling urine. Unclear time of onset. Patient really not able to provide any significant history.  History limited due to altered mental status. Son going to be coming in    Past Medical History Past Medical History:  Diagnosis Date   Asthma    Diabetes mellitus without complication (HCC)    Hypertension    Patient Active Problem List   Diagnosis Date Noted   SBO (small bowel obstruction) (HCC) 11/14/2012   Home Medication(s) Prior to Admission medications   Medication Sig Start Date End Date Taking? Authorizing Provider  albuterol (PROVENTIL HFA;VENTOLIN HFA) 108 (90 BASE) MCG/ACT inhaler Inhale 2 puffs into the lungs every 6 (six) hours as needed for wheezing.     [provider]  fosinopril (MONOPRIL) 10 MG tablet Take 10 mg by mouth daily.    [provider]  glipiZIDE (GLUCOTROL) 10 MG tablet Take 10 mg by mouth daily with breakfast.    [provider]  metFORMIN (GLUCOPHAGE-XR) 500 MG 24 hr tablet Take 500 mg by mouth daily with breakfast.  07/04/15   [provider]  ondansetron (ZOFRAN-ODT) 4 MG disintegrating tablet Take 1 tablet (4 mg total) by mouth 2 (two) times daily. 02/09/16   Earley Favor, NP  traMADol (ULTRAM) 50 MG tablet Take 1 tablet (50 mg total) by mouth every 6 (six) hours as needed. 09/06/20   Wieters, Hallie C, PA-C  triamterene-hydrochlorothiazide (DYAZIDE) 37.5-25 MG per capsule Take 1 capsule by mouth every morning.    [provider]                                                                                                                                     Past Surgical History Past Surgical History:  Procedure Laterality Date   BOWEL RESECTION  11/12/2012   Procedure: SMALL BOWEL RESECTION;  Surgeon: Mariella Saa, MD;  Location: [facility] OR;  Service: General;  Laterality: N/A;   LAPAROTOMY  11/12/2012   Procedure: EXPLORATORY LAPAROTOMY;  Surgeon: Mariella Saa, MD;  Location: [facility] OR;  Service: General;  Laterality: N/A;   Family History Family History  Problem Relation Age of Onset   Cancer Mother    Heart failure Father     Social History Social History   Tobacco Use   Smoking status: Former   Smokeless tobacco: Former    Quit date: 12/05/1992  Vaping Use   Vaping status: Never Used  Substance Use Topics   Alcohol use: No   Drug use: No   Allergies Penicillins and Shellfish allergy  Review of  Systems Review of Systems  All other systems reviewed and are negative.   Physical Exam Vital Signs  I have reviewed the triage vital signs BP (!) 190/92   Pulse 72   Temp 99.1 F (37.3 C) (Oral)   Resp 20   Ht 5\' 4"  (1.626 m)   Wt 43.1 kg   SpO2 100%   BMI 16.31 kg/m  Physical Exam Vitals and nursing note reviewed.  Constitutional:      General: She is not in acute distress.    Appearance: She is well-developed.  HENT:     Head: Normocephalic and atraumatic.     Mouth/Throat:     Mouth: Mucous membranes are moist.  Eyes:     Pupils: Pupils are equal, round, and reactive to light.  Cardiovascular:     Rate and Rhythm: Normal rate and regular rhythm.     Heart sounds: No murmur heard. Pulmonary:     Effort: Pulmonary effort is normal. No respiratory distress.     Breath sounds: Normal breath sounds.  Abdominal:     General: Abdomen is flat.     Palpations: Abdomen is soft.     Tenderness: There is no abdominal tenderness.  Musculoskeletal:        General: No tenderness.     Right lower leg: No edema.     Left lower leg: No edema.  Skin:    General: Skin is  warm and dry.  Neurological:     Mental Status: She is alert.     Comments: Follows some commands, when asked any kind of question patient just replies "okay".  Moves all 4 extremities.  No cranial nerve deficit  Psychiatric:        Mood and Affect: Mood normal.        Behavior: Behavior normal.     ED Results and Treatments Labs (all labs ordered are listed, but only abnormal results are displayed) Labs Reviewed  URINALYSIS, ROUTINE W REFLEX MICROSCOPIC - Abnormal; Notable for the following components:      Result Value   Color, Urine AMBER (*)    Ketones, ur 5 (*)    Protein, ur 30 (*)    Bacteria, UA RARE (*)    All other components within normal limits  COMPREHENSIVE METABOLIC PANEL - Abnormal; Notable for the following components:   Chloride 97 (*)    Alkaline Phosphatase 37 (*)    GFR, Estimated 56 (*)    All other components within normal limits  CBC WITH DIFFERENTIAL/PLATELET  TSH  PROTIME-INR  APTT  AMMONIA  MAGNESIUM  ETHANOL  RAPID URINE DRUG SCREEN, HOSP PERFORMED  CBG MONITORING, ED                                                                                                                          Radiology CT Head Wo Contrast  Addendum Date: 09/01/2023   ADDENDUM REPORT: 09/01/2023 19:08 ADDENDUM: Imaging results were communicated on 09/01/2023 at  6:49 pm to provider Dr. Derry Lory via telephone, who verbally acknowledged these results. Electronically Signed   By: Feliberto Harts M.D.   On: 09/01/2023 19:08   Result Date: 09/01/2023 CLINICAL DATA:  Mental status change, unknown cause EXAM: CT HEAD WITHOUT CONTRAST TECHNIQUE: Contiguous axial images were obtained from the base of the skull through the vertex without intravenous contrast. RADIATION DOSE REDUCTION: This exam was performed according to the departmental dose-optimization program which includes automated exposure control, adjustment of the mA and/or kV according to patient size and/or use of  iterative reconstruction technique. COMPARISON:  None Available. FINDINGS: Brain: Age indeterminate infarct in the left basal ganglia. Somewhat conspicuous hypoattenuation within the white matter of the right greater than left frontoparietal lobes with possible extension to involve overlying right parietal cortex. No evidence of acute hemorrhage, obvious mass lesion, midline shift or hydrocephalus. Vascular: Calcific atherosclerosis. No hyperdense vessel identified. Skull: No acute fracture. Sinuses/Orbits: Clear sinuses.  No acute orbital findings. Other: No mastoid effusions. Dr. Derry Lory paged at the time of dictation for call of report. IMPRESSION: 1. 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 Recommend MRI for further evaluation. Recommend with contrast exam given impression #2. 2. Somewhat conspicuous hypoattenuation within the white matter of the right greater than left frontoparietal lobes with possible extension to involve overlying right parietal cortex. While this could be in part due to chronic microvascular ischemic change, recommend MRI with contrast to exclude other etiologies including ischemia or a mass. Electronically Signed: By: Feliberto Harts M.D. On: 09/01/2023 18:46   CT ANGIO HEAD NECK W WO CM (CODE STROKE)  Result Date: 09/01/2023 CLINICAL DATA:  Neuro deficit, acute, stroke suspected EXAM: CT ANGIOGRAPHY HEAD AND NECK WITH AND WITHOUT CONTRAST TECHNIQUE: Multidetector CT imaging of the head and neck was performed using the standard protocol during bolus administration of intravenous contrast. Multiplanar CT image reconstructions and MIPs were obtained to evaluate the vascular anatomy. Carotid stenosis measurements (when applicable) are obtained utilizing NASCET criteria, using the distal internal carotid diameter as the denominator. RADIATION DOSE REDUCTION: This exam was performed according to the departmental dose-optimization program which includes automated exposure  control, adjustment of the mA and/or kV according to patient size and/or use of iterative reconstruction technique. CONTRAST:  75mL OMNIPAQUE IOHEXOL 350 MG/ML SOLN COMPARISON:  None Available. FINDINGS: CTA NECK FINDINGS Aortic arch: Great vessel origins are patent. Aortic atherosclerosis. Right carotid system: Atherosclerosis at the carotid bifurcation without greater than 50% stenosis. Left carotid system: Atherosclerosis at the carotid bifurcation without greater than 50% stenosis. Vertebral arteries: Left dominant low. Patent bilaterally. Moderate left vertebral artery origin stenosis. Skeleton: No acute abnormality on limited assessment. Multilevel degenerative change. Other neck: Large 4.1 x 4.5 x 8.7 cm centrally necrotic mass at the left thoracic inlet which appears to be arising from the left thyroid with inferior extension into the superior mediastinum and rightward displacement of the esophagus and trachea. Inferior extent of the mass is incompletely imaged. Upper chest: Visualized lung apices are clear. Review of the MIP images confirms the above findings CTA HEAD FINDINGS Anterior circulation: Bilateral intracranial ICAs, MCAs, and ACAs are patent. No proximal high-grade stenosis. Posterior circulation: Bilateral intradural vertebral arteries, basilar artery and bilateral posterior cerebral arteries are patent. No proximal high-grade stenosis. Venous sinuses: Not well evaluated due to arterial timing. No obvious dural venous sinus thrombosis. Review of the MIP images confirms the above findings IMPRESSION: 1. No emergent large vessel occlusion. 2. Moderate left vertebral artery origin stenosis. 3.  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 While indeterminate, findings raise concern for thyroid cancer. Recommend ultrasound  for further characterization and consideration of tissue sampling (ref: J Am Coll Radiol. 2015 Feb;12(2): 143-50). Additionally, the inferior extent of this mass is incompletely characterized and CT of the chest with contrast may be useful to fully evaluate. Imaging results were communicated on 09/01/2023 at 6:49 pm to provider Dr. Derry Lory via telephone, who verbally acknowledged these results. Electronically Signed   By: Feliberto Harts M.D.   On: 09/01/2023 19:08    Pertinent labs & imaging results that were available during my care of the patient were reviewed by me and considered in my medical decision making (see MDM for details).  Medications Ordered in ED Medications  levETIRAcetam (KEPPRA) 1000 MG/100ML IVPB (has no administration in time range)  lacosamide (VIMPAT) 200 mg in sodium chloride 0.9 % 25 mL IVPB (has no administration in time range)  iohexol (OMNIPAQUE) 350 MG/ML injection 75 mL (75 mLs Intravenous Contrast Given 09/01/23 1840)  levETIRAcetam (KEPPRA) IVPB 1000 mg/100 mL premix (0 mg Intravenous Stopped 09/01/23 1919)                                                                                                                                     Procedures .Critical Care  Performed by: Lonell Grandchild, MD Authorized by: Lonell Grandchild, MD   Critical care provider statement:    Critical care time (minutes):  30   Critical care was necessary to treat or prevent imminent or life-threatening deterioration of the following conditions:  CNS failure or compromise   Critical care was time spent personally by me on the following activities:  Development of treatment plan with patient or surrogate, discussions with consultants, evaluation of patient's response to treatment, examination of patient, ordering and review of laboratory studies, ordering and review of radiographic studies, ordering and performing treatments and interventions, pulse oximetry, re-evaluation of  patient's condition and review of old charts   (including critical care time)  Medical Decision Making / ED Course   MDM:  88 year old presenting with altered mental status.  Unclear baseline.  Will need to talk to family.  Differential includes infection, there is some report of foul-smelling urine so we will send urinalysis.  Lungs clear but will check chest x-ray.  No rashes, abdominal tenderness to suggest any cellulitis or intra-abdominal process.  Low concern for CNS infection patient freely moving neck around and in no distress.  Differential also includes toxic or metabolic process will check some basic labs.  Will also obtain CT head given altered mental status.  Will discuss further with the family.  Clinical Course as of 09/01/23 2002  Wed Sep 01, 2023  1820 Patient's son arrived.  Discussed with him and he now reports that patient was normal up until 1 PM this afternoon. She is less responsive than earlier and has a right gaze preference, will activate code stroke [WS]  1914 Patient seen by stroke team and stroke MD.  They think symptoms could represent focal seizure.  They are started patient on Keppra and Vimpat.  They will perform continuous EEG monitoring.  They will continue to manage her seizure medications.  They recommend MRI with and without contrast.  CTA head and neck also shows necrotic thyroid mass concerning for possible malignancy. [WS]  1958 Discussed with family medicine team who will admit patient for further workup [WS]    Clinical Course User Index [WS] Suezanne Jacquet, Jerilee Field, MD     Additional history obtained: -Additional history obtained from family and ems -External records from outside source obtained and reviewed including: Chart review including previous notes, labs, imaging, consultation notes including prior notes   Lab Tests: -I ordered, reviewed, and interpreted labs.   The pertinent results include:   Labs Reviewed  URINALYSIS, ROUTINE W  REFLEX MICROSCOPIC - Abnormal; Notable for the following components:      Result Value   Color, Urine AMBER (*)    Ketones, ur 5 (*)    Protein, ur 30 (*)    Bacteria, UA RARE (*)    All other components within normal limits  COMPREHENSIVE METABOLIC PANEL - Abnormal; Notable for the following components:   Chloride 97 (*)    Alkaline Phosphatase 37 (*)    GFR, Estimated 56 (*)    All other components within normal limits  CBC WITH DIFFERENTIAL/PLATELET  TSH  PROTIME-INR  APTT  AMMONIA  MAGNESIUM  ETHANOL  RAPID URINE DRUG SCREEN, HOSP PERFORMED  CBG MONITORING, ED    Notable for normal electrolytes, normal CBC, UA not really concerning for UTI  EKG   EKG Interpretation Date/Time:  Wednesday September 01 2023 16:49:38 EDT Ventricular Rate:  63 PR Interval:  167 QRS Duration:  148 QT Interval:  444 QTC Calculation: 455 R Axis:   -79  Text Interpretation: Sinus rhythm Supraventricular bigeminy RBBB and LAFB Probable left ventricular hypertrophy Baseline wander in lead(s) V3 Confirmed by Alvino Blood ([id number]) on 09/01/2023 6:02:59 PM         Imaging Studies ordered: I ordered imaging studies including CT head On my interpretation imaging demonstrates possible area of hypoattenuation I independently visualized and interpreted imaging. I agree with the radiologist interpretation   Medicines ordered and prescription drug management: Meds ordered this encounter  Medications   iohexol (OMNIPAQUE) 350 MG/ML injection 75 mL   levETIRAcetam (KEPPRA) IVPB 1000 mg/100 mL premix   levETIRAcetam (KEPPRA) 1000 MG/100ML IVPB    Thielen, Elaine L: cabinet override   lacosamide (VIMPAT) 200 mg in sodium chloride 0.9 % 25 mL IVPB    -I have reviewed the patients home medicines and have made adjustments as needed   Consultations Obtained: I requested consultation with the neurologist,  and discussed lab and imaging findings as well as pertinent plan - they recommend: treat for  possible focal seizure, continuous EEG, MRI brain, they will continue to follow    Cardiac Monitoring: The patient was maintained on a cardiac monitor.  I personally viewed and interpreted the cardiac monitored which showed an underlying rhythm of: NSR  Social Determinants of Health:  Diagnosis or treatment significantly limited by social determinants of health: former smoker   Reevaluation: After the interventions noted above, I reevaluated the  patient and found that their symptoms have stayed the same  Co morbidities that complicate the patient evaluation  Past Medical History:  Diagnosis Date   Asthma    Diabetes mellitus without complication (HCC)    Hypertension       Dispostion: Disposition decision including need for hospitalization was considered, and patient admitted to the hospital.    Final Clinical Impression(s) / ED Diagnoses Final diagnoses:  Focal seizure (HCC)  Altered mental status, unspecified altered mental status type     This chart was dictated using voice recognition software.  Despite best efforts to proofread,  errors can occur which can change the documentation meaning.    Lonell Grandchild, MD 09/01/23 2002

## 2023-09-01 NOTE — ED Notes (Signed)
ED MD at bedside spoke with pt's son. Son stated that pt was normal at 1300 (ambulating and able to speak) and then he next saw her at 1500 and pt was acutely altered. Code stroke initiated.

## 2023-09-01 NOTE — Code Documentation (Signed)
[name] is an 88 yr old female presenting to [facility] on 09/01/2023 for confusion and weakness. Pt was LKW at 1300 today. EDP noted rt gaze and aphasia, and code stroke was activated. Pt not on any known thinners.    Pt back to CT with stroke team. She has previously had a non-contrast CT, which Dr. Derry Lory stated was negative for hemorrhage. NIHSS 20. Pt is weak all over, has rt gaze, and is globally aphasic. (Please see documentation for NIHSS details and code stroke times). CTA obtained. Per Dr. Derry Lory, CTA is negative for LVO.     Pt back to ED room 21 where her workup will continue. She began having some rhythmic twitches on the right. Keppra 1G loaded. Pt will need q 2 hr VS and NIHSS for 12 hours, then q 4. She is not eligible for TNK due to being outside of the treatment window. She is not a candidate for thrombectomy as no LVO. Bedside handoff with ED RN complete.

## 2023-09-01 NOTE — ED Notes (Signed)
While in CT pt evaluated by neurologist and stroke team. Gaze deviated right and pt no longer verbalizing at all. Cts completed then pt back to room. Neurologist suspected possible seizure activity, keppra given per order.

## 2023-09-01 NOTE — ED Notes (Signed)
Pt unwilling/ not responding to my requests to do swallow screen

## 2023-09-02 ENCOUNTER — Inpatient Hospital Stay (HOSPITAL_COMMUNITY): Payer: Medicare PPO

## 2023-09-02 DIAGNOSIS — R569 Unspecified convulsions: Principal | ICD-10-CM

## 2023-09-02 DIAGNOSIS — E079 Disorder of thyroid, unspecified: Secondary | ICD-10-CM | POA: Diagnosis not present

## 2023-09-02 DIAGNOSIS — R4182 Altered mental status, unspecified: Secondary | ICD-10-CM | POA: Diagnosis not present

## 2023-09-02 DIAGNOSIS — E639 Nutritional deficiency, unspecified: Secondary | ICD-10-CM | POA: Diagnosis not present

## 2023-09-02 LAB — BASIC METABOLIC PANEL
Anion gap: 13 (ref 5–15)
BUN: 15 mg/dL (ref 8–23)
CO2: 26 mmol/L (ref 22–32)
Calcium: 10.1 mg/dL (ref 8.9–10.3)
Chloride: 98 mmol/L (ref 98–111)
Creatinine, Ser: 0.89 mg/dL (ref 0.44–1.00)
GFR, Estimated: >60 mL/min (ref >60)
Glucose, Bld: 126 mg/dL — ABNORMAL HIGH (ref 70–99)
Potassium: 3.8 mmol/L (ref 3.5–5.1)
Sodium: 137 mmol/L (ref 135–145)

## 2023-09-02 LAB — BLOOD CULTURE ID PANEL (REFLEXED) - BCID2

## 2023-09-02 LAB — CBC
HCT: 42.5 % (ref 36.0–46.0)
Hemoglobin: 13.7 g/dL (ref 12.0–15.0)
MCH: 29.6 pg (ref 26.0–34.0)
MCHC: 32.2 g/dL (ref 30.0–36.0)
MCV: 91.8 fL (ref 80.0–100.0)
Platelets: 275 10*3/uL (ref 150–400)
RBC: 4.63 MIL/uL (ref 3.87–5.11)
RDW: 13 % (ref 11.5–15.5)
WBC: 5.3 10*3/uL (ref 4.0–10.5)
nRBC: 0 % (ref 0.0–0.2)

## 2023-09-02 LAB — ETHANOL: Alcohol, Ethyl (B): <10 mg/dL (ref <10)

## 2023-09-02 MED ORDER — LEVETIRACETAM IN NACL 1500 MG/100ML IV SOLN
1500.0000 mg | Freq: Once | INTRAVENOUS | Status: AC
  Administered 2023-09-02: 1500 mg via INTRAVENOUS
  Filled 2023-09-02: qty 100

## 2023-09-02 MED ORDER — ENOXAPARIN SODIUM 300 MG/3ML IJ SOLN
20.0000 mg | INTRAMUSCULAR | Status: DC
  Filled 2023-09-02 (×3): qty 0.2

## 2023-09-02 MED ORDER — HYDRALAZINE HCL 20 MG/ML IJ SOLN: 5.0000 mg | Freq: Three times a day (TID) | INTRAMUSCULAR | Status: DC | PRN

## 2023-09-02 MED ORDER — LEVETIRACETAM IN NACL 500 MG/100ML IV SOLN
500.0000 mg | Freq: Two times a day (BID) | INTRAVENOUS | Status: DC
  Administered 2023-09-02 – 2023-09-05 (×7): 500 mg via INTRAVENOUS
  Filled 2023-09-02 (×7): qty 100

## 2023-09-02 MED ORDER — SODIUM CHLORIDE 0.9 % IV SOLN
100.0000 mg | Freq: Two times a day (BID) | INTRAVENOUS | Status: DC
  Administered 2023-09-02: 100 mg via INTRAVENOUS
  Filled 2023-09-02 (×2): qty 10

## 2023-09-02 MED ORDER — ORAL CARE MOUTH RINSE: 15.0000 mL | OROMUCOSAL | Status: DC | PRN

## 2023-09-02 MED ORDER — HYDRALAZINE HCL 20 MG/ML IJ SOLN
5.0000 mg | Freq: Three times a day (TID) | INTRAMUSCULAR | Status: DC | PRN
  Administered 2023-09-04: 5 mg via INTRAVENOUS
  Filled 2023-09-02 (×2): qty 1

## 2023-09-02 NOTE — Assessment & Plan Note (Signed)
Nutrition consultation in place.  Appreciate their recommendations.  SLP swallow study shows that she has no difficulty with swallowing but is refusing anything other than liquids.

## 2023-09-02 NOTE — Plan of Care (Signed)
  Problem: Clinical Measurements: Goal: Respiratory complications will improve Outcome: Progressing Goal: Cardiovascular complication will be avoided Outcome: Progressing   Problem: Coping: Goal: Level of anxiety will decrease Outcome: Progressing   Problem: Elimination: Goal: Will not experience complications related to bowel motility Outcome: Progressing Goal: Will not experience complications related to urinary retention Outcome: Progressing   Problem: Pain Managment: Goal: General experience of comfort will improve Outcome: Progressing   Problem: Safety: Goal: Ability to remain free from injury will improve Outcome: Progressing   Problem: Skin Integrity: Goal: Risk for impaired skin integrity will decrease Outcome: Progressing   

## 2023-09-02 NOTE — Assessment & Plan Note (Signed)
Loaded with Keppra 1000 mg, Vimpat 200 mg.  LTM EEG in progress. - neuro following, appreciate recommendations -Per neuro avoid benzodiazepines at this time - f/u LTM EEG - seizure precautions - will reach out to neuro for BP goals

## 2023-09-02 NOTE — ED Notes (Addendum)
ED TO INPATIENT HANDOFF REPORT  ED Nurse Name and Phone #: Dahlia Client [phone]  S Name/Age/Gender Dawn [name] 88 y.o. female Room/Bed: 021C/021C  Code Status   Code Status: Full Code  Home/SNF/Other Home A+Ox0 Is this baseline? No   Triage Complete: Triage complete  Chief Complaint AMS (altered mental status) [R41.82]  Triage Note [facility] from home d/t worsening confusion, weakness, and frequent urination per family member. Hx dementia at baseline.   Allergies Allergies  Allergen Reactions   Penicillins Anaphylaxis    Has patient had a PCN reaction causing immediate rash, facial/tongue/throat swelling, SOB or lightheadedness with hypotension: Yes Has patient had a PCN reaction causing severe rash involving mucus membranes or skin necrosis: Yes Has patient had a PCN reaction that required hospitalization No Has patient had a PCN reaction occurring within the last 10 years: No If all of the above answers are "NO", then may proceed with Cephalosporin use.   Shellfish Allergy Swelling    Level of Care/Admitting Diagnosis ED Disposition     ED Disposition  Admit   Condition  --   Comment  Hospital Area: [facility] [[id number]]  Level of Care: Progressive [102]  Admit to Progressive based on following criteria: NEUROLOGICAL AND NEUROSURGICAL complex patients with significant risk of instability, who do not meet ICU criteria, yet require close observation or frequent assessment (< / = every 2 - 4 hours) with medical / nursing intervention.  May admit patient to [facility] or [facility] if equivalent level of care is available:: No  Covid Evaluation: Asymptomatic - no recent exposure (last 10 days) testing not required  Diagnosis: AMS (altered mental status) [[id number]]  Admitting Physician: Shelby Mattocks [[id number]]  Attending Physician: Doreene Eland [[id number]]  Certification:: I certify this patient will need inpatient services for at least 2 midnights   Expected Medical Readiness: 09/04/2023          B Medical/Surgery History Past Medical History:  Diagnosis Date   Asthma    Diabetes mellitus without complication (HCC)    Hypertension    Past Surgical History:  Procedure Laterality Date   BOWEL RESECTION  11/12/2012   Procedure: SMALL BOWEL RESECTION;  Surgeon: Mariella Saa, MD;  Location: [facility] OR;  Service: General;  Laterality: N/A;   LAPAROTOMY  11/12/2012   Procedure: EXPLORATORY LAPAROTOMY;  Surgeon: Mariella Saa, MD;  Location: [facility] OR;  Service: General;  Laterality: N/A;     A IV Location/Drains/Wounds Patient Lines/Drains/Airways Status     Active Line/Drains/Airways     Name Placement date Placement time Site Days   Peripheral IV 09/01/23 18 G Left Forearm 09/01/23  1645  Forearm  1   Peripheral IV 09/01/23 20 G Anterior;Right Forearm 09/01/23  1851  Forearm  1            Intake/Output Last 24 hours No intake or output data in the 24 hours ending 09/02/23 0745  Labs/Imaging Results for orders placed or performed during the hospital encounter of 09/01/23 (from the past 48 hour(s))  CBC with Differential     Status: None   Collection Time: 09/01/23  5:31 PM  Result Value Ref Range   WBC 4.7 4.0 - 10.5 K/uL   RBC 3.99 3.87 - 5.11 MIL/uL   Hemoglobin 12.0 12.0 - 15.0 g/dL   HCT 63.8 75.6 - 43.3 %   MCV 93.7 80.0 - 100.0 fL   MCH 30.1 26.0 - 34.0 pg   MCHC 32.1 30.0 -  36.0 g/dL   RDW 28.4 13.2 - 44.0 %   Platelets 252 150 - 400 K/uL   nRBC 0.0 0.0 - 0.2 %   Neutrophils Relative % 71 %   Neutro Abs 3.3 1.7 - 7.7 K/uL   Lymphocytes Relative 17 %   Lymphs Abs 0.8 0.7 - 4.0 K/uL   Monocytes Relative 10 %   Monocytes Absolute 0.5 0.1 - 1.0 K/uL   Eosinophils Relative 1 %   Eosinophils Absolute 0.1 0.0 - 0.5 K/uL   Basophils Relative 1 %   Basophils Absolute 0.0 0.0 - 0.1 K/uL   Immature Granulocytes 0 %   Abs Immature Granulocytes 0.02 0.00 - 0.07 K/uL    Comment: Performed at [facility], [address] [place], [place] [address]  Urinalysis, Routine w reflex microscopic -Urine, Clean Catch     Status: Abnormal   Collection Time: 09/01/23  5:31 PM  Result Value Ref Range   Color, Urine AMBER (A) YELLOW    Comment: BIOCHEMICALS MAY BE AFFECTED BY COLOR   APPearance CLEAR CLEAR   Specific Gravity, Urine 1.025 1.005 - 1.030   pH 5.0 5.0 - 8.0   Glucose, UA NEGATIVE NEGATIVE mg/dL   Hgb urine dipstick NEGATIVE NEGATIVE   Bilirubin Urine NEGATIVE NEGATIVE   Ketones, ur 5 (A) NEGATIVE mg/dL   Protein, ur 30 (A) NEGATIVE mg/dL   Nitrite NEGATIVE NEGATIVE   Leukocytes,Ua NEGATIVE NEGATIVE   RBC / HPF 11-20 0 - 5 RBC/hpf   WBC, UA 0-5 0 - 5 WBC/hpf   Bacteria, UA RARE (A) NONE SEEN   Squamous Epithelial / HPF 0-5 0 - 5 /HPF   Mucus PRESENT    Hyaline Casts, UA PRESENT     Comment: Performed at [facility], [address], [place], [place] [address]  TSH     Status: None   Collection Time: 09/01/23  5:31 PM  Result Value Ref Range   TSH 1.697 0.350 - 4.500 uIU/mL    Comment: Performed by a 3rd Generation assay with a functional sensitivity of <=0.01 uIU/mL. Performed at [facility], [address] [place], [place] [address]   CBG monitoring, ED     Status: None   Collection Time: 09/01/23  6:39 PM  Result Value Ref Range   Glucose-Capillary 85 70 - 99 mg/dL    Comment: Glucose reference range applies only to samples taken after fasting for at least 8 hours.  Protime-INR     Status: None   Collection Time: 09/01/23  6:50 PM  Result Value Ref Range   Prothrombin Time 13.0 11.4 - 15.2 seconds   INR 1.0 0.8 - 1.2    Comment: (NOTE) INR goal varies based on device and disease states. Performed at [facility], [address] [place], [place] [address]   APTT     Status: None   Collection Time: 09/01/23  6:50 PM  Result Value Ref Range   aPTT 26 24 - 36 seconds    Comment: Performed at [facility], [address],  [place], [place] [address]  Ammonia     Status: None   Collection Time: 09/01/23  6:50 PM  Result Value Ref Range   Ammonia 14 9 - 35 umol/L    Comment: Performed at [facility], [address] [place], [place] [address]  Comprehensive metabolic panel     Status: Abnormal   Collection Time: 09/01/23  6:50  PM  Result Value Ref Range   Sodium 139 135 - 145 mmol/L   Potassium 4.6 3.5 - 5.1 mmol/L   Chloride 97 (L) 98 - 111 mmol/L   CO2 27 22 - 32 mmol/L   Glucose, Bld 96 70 - 99 mg/dL    Comment: Glucose reference range applies only to samples taken after fasting for at least 8 hours.   BUN 22 8 - 23 mg/dL   Creatinine, Ser 5.62 0.44 - 1.00 mg/dL   Calcium 13.0 8.9 - 86.5 mg/dL   Total Protein 6.9 6.5 - 8.1 g/dL   Albumin 3.7 3.5 - 5.0 g/dL   AST 24 15 - 41 U/L   ALT 15 0 - 44 U/L   Alkaline Phosphatase 37 (L) 38 - 126 U/L   Total Bilirubin 0.8 0.3 - 1.2 mg/dL   GFR, Estimated 56 (L) >60 mL/min    Comment: (NOTE) Calculated using the CKD-EPI Creatinine Equation (2021)    Anion gap 15 5 - 15    Comment: Performed at [facility], [address], [place], [place] [address]  Magnesium     Status: None   Collection Time: 09/01/23  6:50 PM  Result Value Ref Range   Magnesium 2.2 1.7 - 2.4 mg/dL    Comment: Performed at [facility], [address] [place], [place] [address]  CBC     Status: None   Collection Time: 09/02/23  1:18 AM  Result Value Ref Range   WBC 5.3 4.0 - 10.5 K/uL   RBC 4.63 3.87 - 5.11 MIL/uL   Hemoglobin 13.7 12.0 - 15.0 g/dL   HCT 84.1 32.4 - 40.1 %   MCV 91.8 80.0 - 100.0 fL   MCH 29.6 26.0 - 34.0 pg   MCHC 32.2 30.0 - 36.0 g/dL   RDW 02.7 25.3 - 66.4 %   Platelets 275 150 - 400 K/uL   nRBC 0.0 0.0 - 0.2 %    Comment: Performed at [facility], [address], [place], [place] [address]  Basic metabolic panel     Status: Abnormal   Collection Time: 09/02/23  1:18 AM  Result Value Ref Range   Sodium 137 135 - 145 mmol/L   Potassium  3.8 3.5 - 5.1 mmol/L   Chloride 98 98 - 111 mmol/L   CO2 26 22 - 32 mmol/L   Glucose, Bld 126 (H) 70 - 99 mg/dL    Comment: Glucose reference range applies only to samples taken after fasting for at least 8 hours.   BUN 15 8 - 23 mg/dL   Creatinine, Ser 4.25 0.44 - 1.00 mg/dL   Calcium 95.6 8.9 - 38.7 mg/dL   GFR, Estimated >56 >43 mL/min    Comment: (NOTE) Calculated using the CKD-EPI Creatinine Equation (2021)    Anion gap 13 5 - 15    Comment: Performed at [facility], [address], [place], [place] [address]   Overnight EEG with video  Result Date: 09/02/2023 Charlsie Quest, MD     09/02/2023  7:35 AM Patient Name: TAMAR MIANO MRN: [id number] Epilepsy Attending: Charlsie Quest Referring Physician/Provider: Erick Blinks, MD Duration: 09/01/2023 2322 to 09/02/2023 0730 Patient history: 88 y.o. female with DM2, HTN, dementia, who presents with subtle seizure with R gaze deviation, R head turn and aphasia out of proportion to encephalopathy. Noted to have subtle rhythmic twitching around R eye. EEG to evaluate for seizure Level of alertness: Awake/  lethargic AEDs during EEG study: LEV, LCM Technical aspects: This EEG study was done with scalp electrodes positioned according to the 10-20 International system of electrode placement. Electrical activity was reviewed with band pass filter of 1-70Hz , sensitivity of 7 uV/mm, display speed of 29mm/sec with a 60Hz  notched filter applied as appropriate. EEG data were recorded continuously and digitally stored.  Video monitoring was available and reviewed as appropriate. Description: EEG showed continuous generalized polymorphic 3 to 6 Hz theta-delta slowing. Hyperventilation and photic stimulation were not performed.   EEG was difficult to interpret between 09/02/2023 0012 to 0059 due to significant electrode artifact. ABNORMALITY - Continuous slow, generalized IMPRESSION: This study is suggestive of moderate to severe diffuse  encephalopathy. No seizures or epileptiform discharges were seen throughout the recording. Charlsie Quest   MR BRAIN WO CONTRAST  Result Date: 09/02/2023 CLINICAL DATA:  Initial evaluation for acute delirium, concern for seizure. EXAM: MRI HEAD WITHOUT CONTRAST TECHNIQUE: Multiplanar, multiecho pulse sequences of the brain and surrounding structures were obtained without intravenous contrast. COMPARISON:  Comparison made with prior CTs from 09/01/2023. FINDINGS: Brain: Examination severely degraded by motion artifact, limiting assessment. Cerebral volume within normal limits for age. Tiny remote right cerebellar infarct noted. Extensive patchy and confluent T2/FLAIR hyperintensity seen involving both cerebral hemispheres, most pronounced posteriorly at the parietal lobes. Overall, appearance is favored to reflect changes of PRES. Single punctate focus of diffusion signal abnormality within the left cerebellum on axial DWI sequence (series 2, image 14), not definitely seen on corresponding sequences. While this is favored to be artifactual, a possible tiny ischemic infarct is difficult to exclude. No other evidence for acute or subacute ischemia. No acute intracranial hemorrhage. Innumerable punctate chronic micro hemorrhages are seen involving both cerebral hemispheres, predominantly peripheral in location, suggesting cerebral amyloid angiopathy. No mass lesion or midline shift. No hydrocephalus or extra-axial fluid collection. Pituitary gland and suprasellar region within normal limits. Vascular: Loss of normal flow void within the hypoplastic right V4 segment, consistent with slow flow as seen on prior CTA. Major intracranial vascular flow voids are otherwise maintained. Skull and upper cervical spine: Craniocervical junction within normal limits. Bone marrow signal intensity normal. No scalp soft tissue abnormality. Sinuses/Orbits: Globes and orbital soft tissues within normal limits. Paranasal sinuses are  clear. No significant mastoid effusion. Other: None. IMPRESSION: 1. Motion degraded exam. 2. Extensive patchy and confluent T2/FLAIR hyperintensity involving both cerebral hemispheres, most pronounced posteriorly at the parietal lobes. Given the history of seizure, appearance is favored to reflect sequelae of pressed. 3. Single punctate focus of diffusion signal abnormality within the left cerebellum as above. While this is favored to be artifactual, a possible tiny ischemic infarct is difficult to exclude. 4. Innumerable punctate chronic micro hemorrhages involving both cerebral hemispheres, predominantly peripheral in location, most characteristic of cerebral amyloid angiopathy. Electronically Signed   By: Rise Mu M.D.   On: 09/02/2023 03:39   DG Chest Port 1 View  Result Date: 09/01/2023 CLINICAL DATA:  Weakness, worsening confusion, and frequent urination. EXAM: PORTABLE CHEST 1 VIEW COMPARISON:  11/10/2012. FINDINGS: Heart is enlarged and the mediastinal contour is within normal limits. Lung volumes are low with atelectasis at the lung bases. No effusion or pneumothorax. No acute osseous abnormality. IMPRESSION: Low lung volumes with atelectasis at the lung bases. Electronically Signed   By: Thornell Sartorius M.D.   On: 09/01/2023 20:15   CT Head Wo Contrast  Addendum Date: 09/01/2023   ADDENDUM REPORT: 09/01/2023 19:08 ADDENDUM: Imaging results were  communicated on 09/01/2023 at 6:49 pm to provider Dr. Derry Lory via telephone, who verbally acknowledged these results. Electronically Signed   By: Feliberto Harts M.D.   On: 09/01/2023 19:08   Result Date: 09/01/2023 CLINICAL DATA:  Mental status change, unknown cause EXAM: CT HEAD WITHOUT CONTRAST TECHNIQUE: Contiguous axial images were obtained from the base of the skull through the vertex without intravenous contrast. RADIATION DOSE REDUCTION: This exam was performed according to the departmental dose-optimization program which includes  automated exposure control, adjustment of the mA and/or kV according to patient size and/or use of iterative reconstruction technique. COMPARISON:  None Available. FINDINGS: Brain: Age indeterminate infarct in the left basal ganglia. Somewhat conspicuous hypoattenuation within the white matter of the right greater than left frontoparietal lobes with possible extension to involve overlying right parietal cortex. No evidence of acute hemorrhage, obvious mass lesion, midline shift or hydrocephalus. Vascular: Calcific atherosclerosis. No hyperdense vessel identified. Skull: No acute fracture. Sinuses/Orbits: Clear sinuses.  No acute orbital findings. Other: No mastoid effusions. Dr. Derry Lory paged at the time of dictation for call of report. IMPRESSION: 1. 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 Recommend MRI for further evaluation. Recommend with contrast exam given impression #2. 2. Somewhat conspicuous hypoattenuation within the white matter of the right greater than left frontoparietal lobes with possible extension to involve overlying right parietal cortex. While this could be in part due to chronic microvascular ischemic change, recommend MRI with contrast to exclude other etiologies including ischemia or a mass. Electronically Signed: By: Feliberto Harts M.D. On: 09/01/2023 18:46   CT ANGIO HEAD NECK W WO CM (CODE STROKE)  Result Date: 09/01/2023 CLINICAL DATA:  Neuro deficit, acute, stroke suspected EXAM: CT ANGIOGRAPHY HEAD AND NECK WITH AND WITHOUT CONTRAST TECHNIQUE: Multidetector CT imaging of the head and neck was performed using the standard protocol during bolus administration of intravenous contrast. Multiplanar CT image reconstructions and MIPs were obtained to evaluate the vascular anatomy. Carotid stenosis measurements (when applicable) are obtained utilizing NASCET criteria, using the distal internal carotid diameter as the denominator. RADIATION DOSE REDUCTION: This exam was  performed according to the departmental dose-optimization program which includes automated exposure control, adjustment of the mA and/or kV according to patient size and/or use of iterative reconstruction technique. CONTRAST:  75mL OMNIPAQUE IOHEXOL 350 MG/ML SOLN COMPARISON:  None Available. FINDINGS: CTA NECK FINDINGS Aortic arch: Great vessel origins are patent. Aortic atherosclerosis. Right carotid system: Atherosclerosis at the carotid bifurcation without greater than 50% stenosis. Left carotid system: Atherosclerosis at the carotid bifurcation without greater than 50% stenosis. Vertebral arteries: Left dominant low. Patent bilaterally. Moderate left vertebral artery origin stenosis. Skeleton: No acute abnormality on limited assessment. Multilevel degenerative change. Other neck: Large 4.1 x 4.5 x 8.7 cm centrally necrotic mass at the left thoracic inlet which appears to be arising from the left thyroid with inferior extension into the superior mediastinum and rightward displacement of the esophagus and trachea. Inferior extent of the mass is incompletely imaged. Upper chest: Visualized lung apices are clear. Review of the MIP images confirms the above findings CTA HEAD FINDINGS Anterior circulation: Bilateral intracranial ICAs, MCAs, and ACAs are patent. No proximal high-grade stenosis. Posterior circulation: Bilateral intradural vertebral arteries, basilar artery and bilateral posterior cerebral arteries are patent. No proximal high-grade stenosis. Venous sinuses: Not well evaluated due to arterial timing. No obvious dural venous sinus thrombosis. Review of the MIP images confirms the above findings IMPRESSION: 1. No emergent large vessel occlusion. 2. Moderate left vertebral  artery origin stenosis. 3. Large 8.7 cm centrally necrotic mass at the left thoracic inlet which appears to be arising from the left thyroid with inferior extension into the superior mediastinum and rightward displacement of the  esophagus and trachea. While indeterminate, findings raise concern for thyroid cancer. Recommend ultrasound for further characterization and consideration of tissue sampling (ref: J Am Coll Radiol. 2015 Feb;12(2): 143-50). Additionally, the inferior extent of this mass is incompletely characterized and CT of the chest with contrast may be useful to fully evaluate. Imaging results were communicated on 09/01/2023 at 6:49 pm to provider Dr. Derry Lory via telephone, who verbally acknowledged these results. Electronically Signed   By: Feliberto Harts M.D.   On: 09/01/2023 19:08    Pending Labs Unresulted Labs (From admission, onward)     Start     Ordered   09/08/23 0500  Creatinine, serum  (enoxaparin (LOVENOX)    CrCl < 30 ml/min)  Once,   R       Comments: while on enoxaparin therapy.    09/01/23 2232   09/01/23 2232  Culture, blood (Routine X 2) w Reflex to ID Panel  BLOOD CULTURE X 2,   R (with TIMED occurrences)      09/01/23 2232   09/01/23 1820  Ethanol  (Stroke Panel (PNL))  Once,   URGENT        09/01/23 1820   09/01/23 1820  Urine rapid drug screen (hosp performed)  Once,   STAT        09/01/23 1820            Vitals/Pain Today's Vitals   09/02/23 0445 09/02/23 0530 09/02/23 0615 09/02/23 0630  BP: 97/60 126/81 (!) 151/75 136/69  Pulse: (!) 52 61 (!) 50 (!) 51  Resp: 17  18   Temp: 98.7 F (37.1 C)  98.7 F (37.1 C)   TempSrc: Oral  Oral   SpO2: 100% 99% 99% 100%  Weight:      Height:      PainSc: 0-No pain       Isolation Precautions No active isolations  Medications Medications  levETIRAcetam (KEPPRA) 1000 MG/100ML IVPB (0 mg  Stopped 09/02/23 0138)  enoxaparin (LOVENOX) injection 30 mg (30 mg Subcutaneous Given 09/01/23 2323)  levETIRAcetam (KEPPRA) IVPB 500 mg/100 mL premix (has no administration in time range)  lacosamide (VIMPAT) 100 mg in sodium chloride 0.9 % 25 mL IVPB (has no administration in time range)  hydrALAZINE (APRESOLINE) injection 5 mg (5 mg  Intravenous Not Given 09/02/23 0312)  iohexol (OMNIPAQUE) 350 MG/ML injection 75 mL (75 mLs Intravenous Contrast Given 09/01/23 1840)  levETIRAcetam (KEPPRA) IVPB 1000 mg/100 mL premix ( Intravenous Canceled Entry 09/02/23 0712)  lacosamide (VIMPAT) 200 mg in sodium chloride 0.9 % 25 mL IVPB (0 mg Intravenous Stopped 09/01/23 2052)  levETIRAcetam (KEPPRA) IVPB 1500 mg/ 100 mL premix (0 mg Intravenous Stopped 09/02/23 0136)    Mobility non-ambulatory     Focused Assessments Neuro Assessment Handoff:  Swallow screen pass? No  Cardiac Rhythm: Normal sinus rhythm  Last date known well: 09/01/23 Last time known well: 1300 Neuro Assessment: Exceptions to WDL Neuro Checks:   Initial (09/01/23 1845)  Has TPA been given? No If patient is a Neuro Trauma and patient is going to OR before floor call report to 4N Charge nurse: [phone] or [phone]   R Recommendations: See Admitting Provider Note  Report given to:   Additional Notes: EEG

## 2023-09-02 NOTE — Progress Notes (Signed)
vLTM discontinued  No skin breakdown noted at all skin sites upon d/c.

## 2023-09-02 NOTE — Progress Notes (Signed)
Neurology Progress Note  Brief HPI: 88 y.o. female with DM2, HTN, dementia, who was at her baseline at 1300. Her sone left to run some errands and returned around 3pm to find her confused. She was sitting on the table and told him she wanted to talk to him and stood up and stiff and confused. Son called EMS and she was brought in to the ED. In the ED, she was noted to have R gaze deviation but spontaneously moving all extremities. She would not talk or follow commands and thus a code stroke was activated. She had a CT Head which was negative for ICH but notable for extensive white matter disease with ?extension into R parietal cortex ?ischemia vs mass. CTA with no LVO. In the scanner, noted to have subtle but rhythmic twitching around R eye in addition to R gaze deviation. She is maintaining her wakefullness. She was given Keppra load of 1000mg  IV once, loading with Vimpat next.  Subjective: Patient seen in room with family at the bedside   Exam: Vitals:   09/02/23 0630 09/02/23 0830  BP: 136/69 (!) 143/83  Pulse: (!) 51 (!) 55  Resp:  16  Temp:    SpO2: 100% 98%   Gen: In bed, NAD Resp: non-labored breathing, no acute distress Abd: soft, nt  Neuro: Mental Status: Eyes closed, mumbling "mhmm" when asked questions but does not open eyes voluntarily  Cranial Nerves: II: Pupil equal reactive to light, grimaces with forced eye opening  III,IV, VI: Eyes midline V: Facial sensation is symmetric to temperature VII: Facial movement is symmetric resting and smiling VIII: Hearing is intact to voice X: Palate elevates symmetrically XI: Shoulder shrug is symmetric. XII: Tongue protrudes midline without atrophy or fasciculations.  Motor: Tone is normal. Bulk is poor. Moves all extremities antigravity Sensory: Sensation is symmetric to light touch and temperature in the arms and legs. No extinction to DSS present.  Cerebellar: Does not participate in ecxam to complete Gait: steady or deferred  for safety    Pertinent Labs:   Imaging Reviewed:  MRI Brain: 1. Motion degraded exam. 2. Extensive patchy and confluent T2/FLAIR hyperintensity involving both cerebral hemispheres, most pronounced posteriorly at the parietal lobes. Given the history of seizure, appearance is favored to reflect sequelae of pressed. 3. Single punctate focus of diffusion signal abnormality within the left cerebellum as above. While this is favored to be artifactual, a possible tiny ischemic infarct is difficult to exclude.  4. Innumerable punctate chronic micro hemorrhages involving both cerebral hemispheres, predominantly peripheral in location, most characteristic of cerebral amyloid angiopathy.  LTM EEG 09/01/2023 2322 to 09/02/2023 0730  ABNORMALITY: Continuous slow, generalized IMPRESSION: This study is suggestive of moderate to severe diffuse encephalopathy. No seizures or epileptiform discharges were seen throughout the recording.  Assessment:  : 88 y.o. female with DM2, HTN, dementia, who was at her baseline at 1300. Her sone left to run some errands and returned around 3pm to find her confused. She was sitting on the table and told him she wanted to talk to him and stood up and stiff and confused. Son called EMS and she was brought in to the ED. In the ED, she was noted to have R gaze deviation but spontaneously moving all extremities.  She was given Keppra load of 1000mg  IV once and loaded with vimpat. Now on keppra 500mg  BID and vimpat 100mg  BID. Will d'c LTM today. No seizures noted but consistent with seizure and prolonged post ictal state. She does  additionally have micro hemorrhages consistent with CAA.   Impression:  Seizure with prolonged postictal state   Recommendations: - Discontinue LTM - Continue Keppra 500mg  BID    Discussed [facility] statutes, patients with seizures are not allowed to drive until they have been seizure-free for six months Use caution when using heavy  equipment or power tools. Avoid working on ladders or at heights. Take showers instead of baths. Ensure the water temperature is not too high on the home water heater. Do not go swimming alone. Do not lock yourself in a room alone (i.e. bathroom). When caring for infants or small children, sit down when holding, feeding, or changing them to minimize risk of injury to the child in the event you have a seizure. Maintain good sleep hygiene. Avoid alcohol.   Patient seen and examined by NP/APP with MD. MD to update note as needed.   Elmer Picker, DNP, FNP-BC [facility] Pager: [phone]   NEUROHOSPITALIST ADDENDUM Performed a face to face diagnostic evaluation.   I have reviewed the contents of history and physical exam as documented by PA/ARNP/Resident and agree with above documentation.  I have discussed and formulated the above plan as documented. Edits to the note have been made as needed.  Impression/Key exam findings/Plan: no seizures on LTM EEG. Gradually improving exam. No focal deficit noted but still somnolent and poor participation with exam. I suspect that noted somnolence is likely prolonged post ictal state.  Etiology of seizure likely noted Cerebral amyloid angiopathy and PRES on MRI brain. Recommend aiming for gradual normotension.  No seizures on LTM for several hours now. Will continue Keppra 500mg  BID.  Plan discussed with patient's son and grand son at bedside. They are appreciative of the care that she is receiving here.   Erick Blinks, MD [facility] [phone]   If 7pm to 7am, please call on call as listed on AMION.

## 2023-09-02 NOTE — Assessment & Plan Note (Signed)
Per history given by son and nursing staff the patient is pleasantly confused but otherwise independent at baseline.  On exam she is minimally interactive, only grunting/moaning and shaking her head in response to questions intermittently and otherwise keeping her eyes closed.  Represents a marked change from her previous functional status. -Neurology is on board, appreciate their recommendations -Plan for goals of care discussion with family today -Palliative care consultation pending discussion with family -As needed hydralazine to keep BP between 130 and 150 systolic

## 2023-09-02 NOTE — Assessment & Plan Note (Signed)
Large 8.7 cm mass arising from the left thyroid noted incidentally on imaging in ED. TSH 1.697, WNL. - [place] ordered for further eval

## 2023-09-02 NOTE — Progress Notes (Signed)
MB performed maintenance on electrodes. All impedances are below 10k ohms. No skin breakdown noted.  

## 2023-09-02 NOTE — Assessment & Plan Note (Signed)
BMI 16.  Family concern for dementia, possible poor oral intake and nutrition status.  Patient is very thin. - Nutrition consulted

## 2023-09-02 NOTE — Hospital Course (Addendum)
Dawn [name] is a 88 y.o. female who was admitted to the [facility] Medicine Teaching Service at [facility] for AMS in the setting of PRES. Hospital course is outlined below by problem.   AMS from seizures likely d/t PRES and cerebral amyloid angiopathy Presented after sudden change from baseline with new onset difficulty with speech and ambulation. HDS though unable to participate in any aspect of exam on arrival.  Chest x-ray, CT head without acute abnormality.  CTA head and neck with thyroid mass as below.  Labs overall unremarkable.  Neurology consulted who started EEG, Keppra, Vimpat given concern for seizure.  MRI collected which demonstrated findings consistent with PRES and cerebral amyloid angiopathy. Given extent of patient's presentation, goals of care discussions initiated and patient made DNR/DNI. Ultimately family decided to take patient home with home Hospice.  Thyroid mass Incidentally discovered on CT a head and neck as above.  8.7 cm in size arising from left thyroid.  TSH within normal limits.  Thyroid ultrasound with nodule meeting criteria for FNA outpatient as well as diffuse goitrous enlargement of the entire left hemithyroid.  Severe protein calorie malnutrition BMI 16 on presentation with evidence of cachexia.  Dietitian consulted with recommendations to eat as desired and to offer Ensure Enlive supplements. She initially received IV fluids during her admission due to low urine output, but when decision was made for Hospice the patient's IV fluids were discontinued; she did begin to PO better by the time of discharge and was able to feed herself (representing a return to baseline).  Other conditions that were chronic and stable: T2DM: Held home medications (Glipizide 10 mg daily, metformin 500 mg daily) in light of low p.o. intake, and monitored CBG with morning labs.  However, did not resume glipizide given risk of hypoglycemia. HTN: Valsartan 150 mg daily   Issues for follow  up: Consider thyroid FNA outpatient as desired BP mildly elevated over admission, can titrate as desired

## 2023-09-02 NOTE — Progress Notes (Signed)
Daily Progress Note Intern Pager: [phone]  Patient name: Dawn [name] Medical record number: [id number] Date of birth: 07-May-1935 Age: 88 y.o. Gender: female  Primary Care Provider: Billee Cashing, MD Consultants: Neurology Code Status: Full  Pt Overview and Major Events to Date:  Admitted: 10/9  Assessment and Plan:  Dawn [name] is an 88 year old woman presenting from home with altered mental status.  She has a history of hypertension, diabetes, hyperlipidemia and depression.  Given her presentation with sudden change in mentation on presentation to the emergency department neurology was involved for stroke workup.  She was witnessed to be having rhythmic twitching while undergoing CT scan and neurology initiated workup for PR ES including EEG and MRI.  She was incidentally found to have an uncharacterized thyroid mass which on ultrasound is suspicious for malignancy.  At this time infectious causes of her altered mental status are unlikely given her normal white blood cell count and electrolytes.  Given her advanced age and rapid deterioration goals of care conversation with the family will be high priority. Assessment & Plan Altered mental status, unspecified altered mental status type Per history given by son and nursing staff the patient is pleasantly confused but otherwise independent at baseline.  On exam she is minimally interactive, only grunting/moaning and shaking her head in response to questions intermittently and otherwise keeping her eyes closed.  Represents a marked change from her previous functional status. -Neurology is on board, appreciate their recommendations -Plan for goals of care discussion with family today -Palliative care consultation pending discussion with family -As needed hydralazine to keep BP between 130 and 150 systolic Focal seizure [facility]) Neurology has continuous EEG in place.  They have also loaded the patient with Keppra and added  Vimpat for treatment of any ongoing seizures Poor nutrition Nutrition consultation in place.  Appreciate their recommendations.  SLP swallow study shows that she has no difficulty with swallowing but is refusing anything other than liquids. Thyroid mass 2.4 cm TI RADS 4 nodule seen in the left glandular tissue.  FNA biopsy recommended.  Further action pending goals of care discussion with family.   Chronic and Stable Issues: Hypertension-hydralazine as above HLD: Hold home medications Diabetes: Hold home medications Depression: Hold home medications  FEN/GI: Regular as tolerated PPx: Lovenox Dispo:Home  pending family discussion .   Subjective:  Frail-appearing elderly woman lying in bed on exam this morning.  She was not interactive during exam.  Objective: Temp:  [98.7 F (37.1 C)-99.1 F (37.3 C)] 98.7 F (37.1 C) (10/10 0615) Pulse Rate:  [50-73] 51 (10/10 0630) Resp:  [15-21] 18 (10/10 0615) BP: (97-196)/(60-106) 136/69 (10/10 0630) SpO2:  [97 %-100 %] 100 % (10/10 0630) Weight:  [43.1 kg] 43.1 kg (10/09 1656) Physical Exam: General: Minimally interactive.  Frail-appearing, no obvious distress Neck: Trachea midline, palpable left-sided neck mass consistent with CT findings. Cardiovascular: Bradycardic, mildly irregular heart rate.  No M/R/G Respiratory: CTAB, no increased work of breathing Abdomen: Flat soft nontender Extremities: 2+ pulses in the bilateral upper extremity and lower extremity  Laboratory: Most recent CBC Lab Results  Component Value Date   WBC 5.3 09/02/2023   HGB 13.7 09/02/2023   HCT 42.5 09/02/2023   MCV 91.8 09/02/2023   PLT 275 09/02/2023   Most recent BMP    Latest Ref Rng & Units 09/02/2023    1:18 AM  BMP  Glucose 70 - 99 mg/dL 147   BUN 8 - 23 mg/dL 15   Creatinine  0.44 - 1.00 mg/dL 1.61   Sodium 096 - 045 mmol/L 137   Potassium 3.5 - 5.1 mmol/L 3.8   Chloride 98 - 111 mmol/L 98   CO2 22 - 32 mmol/L 26   Calcium 8.9 - 10.3  mg/dL 40.9     Imaging/Diagnostic Tests:  US thyroid IMPRESSION: 1. Diffuse goitrous enlargement of the entire left hemi thyroid. 2. Probable 2.4 cm TI-RADS category 4 nodule versus pseudo nodule in the left mid gland meets criteria to consider fine-needle aspiration biopsy.  Gerrit Heck, DO 09/02/2023, 7:42 AM  PGY-1, [facility] FPTS Intern pager: [phone], text pages welcome Secure chat group [facility] Teaching Service

## 2023-09-02 NOTE — Progress Notes (Signed)
PHARMACY - PHYSICIAN COMMUNICATION CRITICAL VALUE ALERT - BLOOD CULTURE IDENTIFICATION (BCID)  Dawn [name] is an 88 y.o. female who presented to [facility] on 09/01/2023 with a chief complaint of AMS presumed to be due to seizures.   Assessment:  1/3 bottles positive for Staphylococcus epidermidis  Name of physician (or Provider) Contacted: Cyndia Skeeters, MD  Current antibiotics: none  Changes to prescribed antibiotics recommended:  Culture result is likely a skin flora contaminant and no antibiotic therapy is warranted since pt does not display any clinical s/sx of infection. Recommended to monitor pt and not start any antibiotics at this time. Dr. Mliss Sax agreed with recommendation.  Results for orders placed or performed during the hospital encounter of 09/01/23  Blood Culture ID Panel (Reflexed) (Collected: 09/02/2023  1:18 AM)  Result Value Ref Range   Enterococcus faecalis NOT DETECTED NOT DETECTED   Enterococcus Faecium NOT DETECTED NOT DETECTED   Listeria monocytogenes NOT DETECTED NOT DETECTED   Staphylococcus species DETECTED (A) NOT DETECTED   Staphylococcus aureus (BCID) NOT DETECTED NOT DETECTED   Staphylococcus epidermidis DETECTED (A) NOT DETECTED   Staphylococcus lugdunensis NOT DETECTED NOT DETECTED   Streptococcus species NOT DETECTED NOT DETECTED   Streptococcus agalactiae NOT DETECTED NOT DETECTED   Streptococcus pneumoniae NOT DETECTED NOT DETECTED   Streptococcus pyogenes NOT DETECTED NOT DETECTED   A.calcoaceticus-baumannii NOT DETECTED NOT DETECTED   Bacteroides fragilis NOT DETECTED NOT DETECTED   Enterobacterales NOT DETECTED NOT DETECTED   Enterobacter cloacae complex NOT DETECTED NOT DETECTED   Escherichia coli NOT DETECTED NOT DETECTED   Klebsiella aerogenes NOT DETECTED NOT DETECTED   Klebsiella oxytoca NOT DETECTED NOT DETECTED   Klebsiella pneumoniae NOT DETECTED NOT DETECTED   Proteus species NOT DETECTED NOT DETECTED   Salmonella species NOT  DETECTED NOT DETECTED   Serratia marcescens NOT DETECTED NOT DETECTED   Haemophilus influenzae NOT DETECTED NOT DETECTED   Neisseria meningitidis NOT DETECTED NOT DETECTED   Pseudomonas aeruginosa NOT DETECTED NOT DETECTED   Stenotrophomonas maltophilia NOT DETECTED NOT DETECTED   Candida albicans NOT DETECTED NOT DETECTED   Candida auris NOT DETECTED NOT DETECTED   Candida glabrata NOT DETECTED NOT DETECTED   Candida krusei NOT DETECTED NOT DETECTED   Candida parapsilosis NOT DETECTED NOT DETECTED   Candida tropicalis NOT DETECTED NOT DETECTED   Cryptococcus neoformans/gattii NOT DETECTED NOT DETECTED   Methicillin resistance mecA/C NOT DETECTED NOT DETECTED    Wilburn Cornelia, PharmD, BCPS Clinical Pharmacist 09/02/2023 10:08 PM   Please refer to AMION for pharmacy phone number

## 2023-09-02 NOTE — Progress Notes (Addendum)
Paged by RN that patient was having bradycardia in the 40s. Went to the bedside with Dr. Hyacinth Meeker to evaluate the patient.  Objective: Laying in the bed. Does not open her eyes or respond to verbal or tactile stimuli.   Appears comfortable.  With slow but regular respirations. On auscultation, heart regular rate and rhythm, in the 50s bpm.  PLAN:  The patient's  two sons were in the room.  We had a long conversation regarding goals of care:  -Her sons shared with me that [name] is  a sweet, devoted and feisty woman.  She is a mother to 5 children, 2 of whom have passed away.  She worked in the [employer] at [employer] for 25 years.  -I explained her current medical condition and events thus far including labs (overall unremarkable), MRI brain with possible concern for PRES, neurology with possible concern for seizures for which she is on antiseizure medications.  -We also explored that there is a possibility she has a small posibility improve clinically, she will likely not return to her baseline prior to admission. Given her age and current level of mentation she is showing signs of possibly being at end of her life by sleeping most of the time, not wanting food or drink, etc.  -Given that patient is not able to express her own wishes (she has been lethargic and sleeping since admission), her sons wish to change the patient's CODE STATUS from full code to DNR/DNI.  Order placed. They understand that this means we will treat the treatable, but will not perform chest compressions or intubate.  They believe this best aligns with her wishes, and unfortunately their father went through a traumatic spell of illness in which she required CPR/intubation and do not want this for their mother.     Palliative team to come discuss later as well-consider comfort care.  Darral Dash, DO PGY-3 [facility]

## 2023-09-02 NOTE — Evaluation (Signed)
Clinical/Bedside Swallow Evaluation Patient Details  Name: Dawn [name] MRN: [id number] Date of Birth: March 11, 1935  Today's Date: 09/02/2023 Time: SLP Start Time (ACUTE ONLY): 0820 SLP Stop Time (ACUTE ONLY): 0828 SLP Time Calculation (min) (ACUTE ONLY): 8 min  Past Medical History:  Past Medical History:  Diagnosis Date   Asthma    Diabetes mellitus without complication (HCC)    Hypertension    Past Surgical History:  Past Surgical History:  Procedure Laterality Date   BOWEL RESECTION  11/12/2012   Procedure: SMALL BOWEL RESECTION;  Surgeon: Mariella Saa, MD;  Location: [facility] OR;  Service: General;  Laterality: N/A;   LAPAROTOMY  11/12/2012   Procedure: EXPLORATORY LAPAROTOMY;  Surgeon: Mariella Saa, MD;  Location: [facility] OR;  Service: General;  Laterality: N/A;   HPI:  Dawn [name] is a 88 y.o. female presenting with AMS, LKN 1pm this afternoon. MRI 10/10: possible PRES. CXR 10/9 with atalectasis. CT Head and Neck with findings of "Large 8.7 cm centrally necrotic mass at the left thoracic inlet which appears to be arising from the left thyroid with inferior extension into the superior mediastinum and rightward displacement of the esophagus and trachea." Workup pending.  Pt with history of dementia, diabetes, hypertension.    Assessment / Plan / Recommendation  Clinical Impression  Pt presents with a cognitive based dysphagia with extremely poor oral acceptance.  Pt withdrew from bolus presentation.  SLP was able to place water, ice chip, and puree in oral cavity. Pt exhibited oral response once bolus was in oral cavity and there were no clinical s/s of aspiration.  With cup sip there was anterior spillage and oral hold of at least a portion of the bolus.  Pt does not appear to have significant risk of aspiration, but it was very difficult to get her to accept POs. Cannot recommend oral diet at this time. Consider repeating Yale swallow screen if AMS improves and/or pt  requests POs.  SLP will follow for PO readiness as well.  Findings of possible thyroid mass concerning for esophageal dysphagia with noted displacement of esophagus and trachea.  Recommend pt remain NPO at present, but priority oral meds could be administered orally if pt will accept them.  Pt may have ice chips by spoon for comfort, after good oral care, in moderation, when fully awake/alert, with upright positioning and supervision.   SLP Visit Diagnosis: Dysphagia, unspecified (R13.10)    Aspiration Risk  Mild aspiration risk    Diet Recommendation NPO    Medication Administration:  (as tolerated, no specific precautions)    Other  Recommendations Oral Care Recommendations: Oral care QID;Oral care prior to ice chip/H20    Recommendations for follow up therapy are one component of a multi-disciplinary discharge planning process, led by the attending physician.  Recommendations may be updated based on patient status, additional functional criteria and insurance authorization.  Follow up Recommendations Follow physician's recommendations for discharge plan and follow up therapies      Assistance Recommended at Discharge    Functional Status Assessment Patient has had a recent decline in their functional status and demonstrates the ability to make significant improvements in function in a reasonable and predictable amount of time.  Frequency and Duration min 2x/week  2 weeks       Prognosis Prognosis for improved oropharyngeal function: Good Barriers to Reach Goals: Cognitive deficits      Swallow Study   General HPI: Dawn [name] is a 88 y.o. female presenting  with AMS, LKN 1pm this afternoon. MRI 10/10: possible PRES. CXR 10/9 with atalectasis. CT Head and Neck with findings of "Large 8.7 cm centrally necrotic mass at the left thoracic inlet which appears to be arising from the left thyroid with inferior extension into the superior mediastinum and rightward displacement of  the esophagus and trachea." Workup pending.  Pt with history of dementia, diabetes, hypertension. Type of Study: Bedside Swallow Evaluation Previous Swallow Assessment: None Diet Prior to this Study: NPO Temperature Spikes Noted: No Respiratory Status: Room air History of Recent Intubation: No Behavior/Cognition: Alert;Uncooperative Oral Cavity Assessment:  (Unable to assess) Oral Cavity - Dentition:  (dentition present unknown if natural, or denture plates) Self-Feeding Abilities: Total assist Patient Positioning: Upright in bed Baseline Vocal Quality: Not observed Volitional Cough: Cognitively unable to elicit Volitional Swallow: Unable to elicit    Oral/Motor/Sensory Function Overall Oral Motor/Sensory Function:  (Unable to assess)   Ice Chips Ice chips: Within functional limits Presentation: Spoon   Thin Liquid Presentation: Spoon;Cup Oral Phase Functional Implications: Oral holding    Nectar Thick Nectar Thick Liquid: Not tested   Honey Thick Honey Thick Liquid: Not tested   Puree Oral Phase Impairments:  (Poor oral acceptance)   Solid     Solid: Not tested      [name], MA, CCC-SLP Acute Rehabilitation Services Office: [phone] 09/02/2023,8:58 AM

## 2023-09-02 NOTE — Procedures (Addendum)
Patient Name: MAKENZYE TROUTMAN  MRN: [id number]  Epilepsy Attending: Charlsie Quest  Referring Physician/Provider: Erick Blinks, MD  Duration: 09/01/2023 2322 to 09/02/2023 1132  Patient history: 88 y.o. female with DM2, HTN, dementia, who presents with subtle seizure with R gaze deviation, R head turn and aphasia out of proportion to encephalopathy. Noted to have subtle rhythmic twitching around R eye. EEG to evaluate for seizure  Level of alertness: Awake/ lethargic  AEDs during EEG study: LEV, LCM  Technical aspects: This EEG study was done with scalp electrodes positioned according to the 10-20 International system of electrode placement. Electrical activity was reviewed with band pass filter of 1-70Hz , sensitivity of 7 uV/mm, display speed of 46mm/sec with a 60Hz  notched filter applied as appropriate. EEG data were recorded continuously and digitally stored.  Video monitoring was available and reviewed as appropriate.  Description: EEG showed continuous generalized polymorphic 3 to 6 Hz theta-delta slowing. Hyperventilation and photic stimulation were not performed.     EEG was difficult to interpret between 09/02/2023 0012 to 0059 due to significant electrode artifact.  ABNORMALITY - Continuous slow, generalized  IMPRESSION: This study is suggestive of moderate to severe diffuse encephalopathy. No seizures or epileptiform discharges were seen throughout the recording.  [employer] Annabelle Harman

## 2023-09-02 NOTE — Assessment & Plan Note (Signed)
Neurology has continuous EEG in place.  They have also loaded the patient with Keppra and added Vimpat for treatment of any ongoing seizures

## 2023-09-02 NOTE — Assessment & Plan Note (Signed)
2.4 cm TI RADS 4 nodule seen in the left glandular tissue.  FNA biopsy recommended.  Further action pending goals of care discussion with family.

## 2023-09-03 DIAGNOSIS — E079 Disorder of thyroid, unspecified: Secondary | ICD-10-CM | POA: Diagnosis not present

## 2023-09-03 DIAGNOSIS — Z515 Encounter for palliative care: Secondary | ICD-10-CM | POA: Diagnosis not present

## 2023-09-03 DIAGNOSIS — Z7189 Other specified counseling: Secondary | ICD-10-CM

## 2023-09-03 DIAGNOSIS — R4182 Altered mental status, unspecified: Secondary | ICD-10-CM | POA: Diagnosis not present

## 2023-09-03 DIAGNOSIS — E639 Nutritional deficiency, unspecified: Secondary | ICD-10-CM | POA: Diagnosis not present

## 2023-09-03 DIAGNOSIS — R569 Unspecified convulsions: Secondary | ICD-10-CM | POA: Diagnosis not present

## 2023-09-03 LAB — CULTURE, BLOOD (ROUTINE X 2): Special Requests: ADEQUATE

## 2023-09-03 MED ORDER — ENSURE ENLIVE PO LIQD
237.0000 mL | Freq: Three times a day (TID) | ORAL | Status: DC
  Administered 2023-09-03 – 2023-09-07 (×11): 237 mL via ORAL

## 2023-09-03 MED ORDER — SODIUM CHLORIDE 0.9 % IV SOLN: INTRAVENOUS | Status: DC

## 2023-09-03 NOTE — Consult Note (Signed)
Palliative Care Consult Note                                  Date: 09/03/2023   Patient Name: Dawn [name]  DOB: Feb 17, 1935  MRN: [id number]  Age / Sex: 88 y.o., female  PCP: Billee Cashing, MD Referring Physician: Doreene Eland, MD  Reason for Consultation: Establishing goals of care  HPI/Patient Profile: 88 y.o. female  with past medical history of hypertension, diabetes, hyperlipidemia, and asthma admitted on 09/01/2023 with a change in mental status and seizure (witnessed in ED).   Past Medical History:  Diagnosis Date   Asthma    Diabetes mellitus without complication (HCC)    Hypertension     Subjective:   I have reviewed medical records including EPIC notes, labs and imaging, assessed the patient and then met with the patient's son Dawn [name] and grandson Dawn [name] to discuss diagnosis prognosis, GOC, EOL wishes, disposition and options.  I introduced Palliative Medicine as specialized medical care for people living with serious illness. It focuses on providing relief from symptoms and stress of a serious illness. The goal is to improve quality of life for both the patient and the family.  Patient's family face treatment option decisions, advanced directive decisions, and anticipatory care needs.  Today's Discussion: Patient's family have a good understanding of the patients chronic and acute illnesses. We discussed her altered mental status, suspected PRES, seizures, thyroid mass, and overall failure to thrive. The patient lives with her daughter for several decades. Prior to admission she was able to do most ADLs independently, but required assistance with IADLs. She has lost weight over the last several years. She recently began refusing and hiding medications. Her family states she eats a decent amount at meals. The patient retired from [employer] where she worked 24 years. She is widowed with three grown children (2 have  passed away).   We discussed the patient's comorbidities.  I reviewed what her life moving forward may look like and emphasized the importance of considering her quality of life, overall suffering, what would be acceptable to her in the future. Encouraged the family to consider what interventions the patient would or would not want moving forward. The patient's son and grandson do not believe she would want to have any potential therapies for her thyroid mass so they do not believe she would want to proceed with further testing for this.   A discussion was had today regarding advanced directives. Concepts specific to code status, artifical feeding, and rehospitalization was had.  Confirmed DNR/DNI status. The difference between a aggressive medical intervention path and a palliative comfort care path for this patient at this time was had. We discussed what a comfort path would look like for this patient. The family want to make sure she does not suffer.  Discussed the importance of continued conversation with family and the medical providers regarding overall plan of care and treatment options, ensuring decisions are within the context of the patient's values and GOCs. It is important that we discuss goals of care with the patient's three children who are her decision makers. Son will notify PMT of time this weekend (hopefully Saturday) to meet again.  Questions and concerns were addressed. Hard Choices booklet left for review. The family was encouraged to call with questions or concerns. PMT will continue to support holistically.  Review of Systems  Unable to perform ROS  Objective:   Primary Diagnoses: Present on Admission:  AMS (altered mental status)   Physical Exam Vitals reviewed.  Constitutional:      General: She is sleeping.     Appearance: She is underweight.  HENT:     Head: Normocephalic and atraumatic.  Cardiovascular:     Rate and Rhythm: Bradycardia present.  Skin:     General: Skin is warm and dry.  Neurological:     Mental Status: She is easily aroused.     Vital Signs:  BP (!) 149/51 (BP Location: Left Arm)   Pulse (!) 52   Temp 98.5 F (36.9 C) (Oral)   Resp 16   Ht 5\' 4"  (1.626 m)   Wt 43.1 kg   SpO2 95%   BMI 16.31 kg/m    Advanced Care Planning:   Existing Vynca/ACP Documentation: None  Primary Decision Maker: NEXT OF KIN  Code Status/Advance Care Planning: DNR   Assessment & Plan:   SUMMARY OF RECOMMENDATIONS   DNR/DNI Limited scope of care Encouraged the family to continue discussing goals of care for patient Family meeting this weekend- Time TBD Continued PMT support   Discussed with: attending provider  Time Total: 75 minutes   Thank you for allowing [place] to participate in the care of Dawn [name] PMT will continue to support holistically.   Signed by: Sarina Ser, NP Palliative Medicine Team  Team Phone # [phone] (Nights/Weekends)  09/03/2023, 1:38 PM

## 2023-09-03 NOTE — Progress Notes (Signed)
Daily Progress Note Intern Pager: [phone]  Patient name: Dawn [name] Medical record number: [id number] Date of birth: 07-Aug-1935 Age: 88 y.o. Gender: female  Primary Care Provider: Billee Cashing, MD Consultants: Palliative, Neuro Code Status: DNR/DNI  Pt Overview and Major Events to Date:  Admitted: 10/8 DNR/DNI status: 10/9  Assessment and Plan:  Dawn [name] is an 88 year old lady with a history of dementia, hypertension, diabetes, hyperlipidemia and depression presenting with acute mental status changes with suspected PRES. Per neurology they suspect her current state is related to a seizure and underlying brain pathology.  Palliative has been consulted and they will have a conversation with the family today about goals of care. Assessment & Plan AMS (altered mental status) Patient remained asleep throughout evaluation this morning despite efforts to engage her.  After discussion yesterday family has been made at termination to have her DNR/DNI, palliative consult is in place to discuss further goals of care.  Blood cultures returned yesterday and were negative, blood alcohol level negative, still awaiting UDS to close out initial screening. -Neurology is on board, appreciate their recommendations -Palliative consult in place, awaiting recommendations -As needed hydralazine to keep BP between 130 and 150 systolic Focal seizure (HCC) No ongoing seizure activity detected on EEG.  Continue to follow neurology's recommendations -Keppra twice daily -Vimpat once daily Poor nutrition Nutrition consultation in place.  Appreciate their recommendations.  SLP swallow study shows that she has no difficulty with swallowing but is refusing anything other than liquids. -regular diet as desired -not passing urine, added on IV fluids Thyroid mass 2.4 cm TI RADS 4 nodule seen in the left glandular tissue.  FNA biopsy recommended.  Further action pending goals of care discussion  with family.   Chronic and Stable Problems:  Hypertension: Blood pressure management as seen above Diabetes: Monitor CBGs HLD: Holding home meds Depression: Holding home meds in setting of altered mental status   FEN/GI: N.p.o. PPx: Lovenox Dispo:Home pending clinical improvement .   Subjective:  Dawn [name] was asleep on exam today.  She did not respond to any questions or prompts but appeared to be resting comfortably.  She did move all 4 limbs on her own during the exam.  Objective: Temp:  [97.6 F (36.4 C)-98.7 F (37.1 C)] 98.4 F (36.9 C) (10/11 0330) Pulse Rate:  [47-61] 52 (10/11 0330) Resp:  [14-16] 16 (10/11 0330) BP: (117-181)/(59-86) 154/64 (10/11 0330) SpO2:  [97 %-100 %] 97 % (10/11 0330) Physical Exam: General: Asleep, no obvious distress Cardiovascular: Regular rate and rhythm, no M/R/G Respiratory: CTAB, no increased work of breathing Abdomen: Flat, soft Extremities: 2+ pulses in all 4 extremities  Laboratory: Most recent CBC Lab Results  Component Value Date   WBC 5.3 09/02/2023   HGB 13.7 09/02/2023   HCT 42.5 09/02/2023   MCV 91.8 09/02/2023   PLT 275 09/02/2023   Most recent BMP    Latest Ref Rng & Units 09/02/2023    1:18 AM  BMP  Glucose 70 - 99 mg/dL 147   BUN 8 - 23 mg/dL 15   Creatinine 8.29 - 1.00 mg/dL 5.62   Sodium 130 - 865 mmol/L 137   Potassium 3.5 - 5.1 mmol/L 3.8   Chloride 98 - 111 mmol/L 98   CO2 22 - 32 mmol/L 26   Calcium 8.9 - 10.3 mg/dL 78.4     Gerrit Heck, DO 09/03/2023, 8:55 AM  PGY-1, [facility] FPTS Intern pager: [phone], text pages welcome Secure  chat group [facility] Teaching Service

## 2023-09-03 NOTE — Assessment & Plan Note (Signed)
2.4 cm TI RADS 4 nodule seen in the left glandular tissue.  FNA biopsy recommended.  Further action pending goals of care discussion with family.

## 2023-09-03 NOTE — Progress Notes (Signed)
Speech Language Pathology Treatment: Dysphagia  Patient Details Name: Dawn [name] MRN: [id number] DOB: [birth year] Today's Date: 09/03/2023 Time: 3557-3220 SLP Time Calculation (min) (ACUTE ONLY): 11 min  Assessment / Plan / Recommendation Clinical Impression  Pt was seen for skilled ST treatment targeting diet tolerance following upgrade to a regular diet.  Pt was encountered asleep in bed and roused to moderate verbal stimulation.  She was seen with minimal trials of thin liquid via siphoned straw and puree.  She refused most trials via tight labial seal and head shake.  No overt s/sx of aspiration were observed with limited trials.  Will downgrade diet to Dysphagia 1 (puree) solids and thin liquids as this appears to be the safest diet for the pt at this time.  Would only offer PO when pt is awake/alert and expressing or displaying interest.  Patient is at an elevated risk for not meeting her nutritional needs via PO intake alone. Recommend continued discussions with palliative team to determine GOC regarding alternative means of nutrition vs comfort feeding.     HPI HPI: CRYSTIN LECHTENBERG is a 88 y.o. female presenting with AMS, LKN 1pm this afternoon. MRI 10/10: possible PRES. CXR 10/9 with atalectasis. CT Head and Neck with findings of "Large 8.7 cm centrally necrotic mass at the left thoracic inlet which appears to be arising from the left thyroid with inferior extension into the superior mediastinum and rightward displacement of the esophagus and trachea." Workup pending.  Pt with history of dementia, diabetes, hypertension.      SLP Plan  Continue with current plan of care      Recommendations for follow up therapy are one component of a multi-disciplinary discharge planning process, led by the attending physician.  Recommendations may be updated based on patient status, additional functional criteria and insurance authorization.    Recommendations  Diet recommendations:  Dysphagia 1 (puree);Thin liquid Liquids provided via: Teaspoon;Straw Medication Administration: Via alternative means Supervision: Full supervision/cueing for compensatory strategies Compensations: Slow rate;Small sips/bites                  Oral care BID;Staff/trained caregiver to provide oral care   Frequent or constant Supervision/Assistance Dysphagia, unspecified (R13.10)     Continue with current plan of care   Eino Farber, M.S., CCC-SLP Acute Rehabilitation Services Office: [phone]   Shanon Rosser [facility]  09/03/2023, 2:07 PM

## 2023-09-03 NOTE — Progress Notes (Signed)
Initial Nutrition Assessment  DOCUMENTATION CODES:  Non-severe (moderate) malnutrition in context of social or environmental circumstances  INTERVENTION:  Continue DYS 1 diet as recommended by SLP Ensure Enlive po TID, each supplement provides 350 kcal and 20 grams of protein. If pt continues to refuse to eat, consider cortrak on Monday if in-line with GOC. Pt is likely more appropriate for comfort feeds given age and co-morbidities   NUTRITION DIAGNOSIS:  Moderate Malnutrition related to social / environmental circumstances as evidenced by severe muscle depletion, moderate fat depletion.  GOAL:  Patient will meet greater than or equal to 90% of their needs  MONITOR:  PO intake, Supplement acceptance, Weight trends  REASON FOR ASSESSMENT:  Consult Assessment of nutrition requirement/status  ASSESSMENT:  Pt with hx of DM, HTN, and dementia presented to ED after family found her confused and having difficulty walking.   Pt resting in bed at the time of assessment. No family is at bedside to provide a nutrition hx. Pt opened her eyes during physical assessment but would not answer questions or respond in anyway.   Pt does have significant muscle deficits present, but suspect that weight in chart is wrong based on visual appearance. Does meet criteria for malnutrition.   Noted that SLP recommended DYS1 diet for pt this afternoon but that pt is refusing PO. Currently requiring IVF. PMT is following. If pt is still refusing to eat and family wishes to persue aggressive care, could consider placement of cor trak on Monday and starting enteral feeds. Pt is likely more appropriate for careful hand feeding if she shows interest.   Admit / Current weight: 43.1 kg   Medications and Labs Reviewed  NUTRITION - FOCUSED PHYSICAL EXAM: Flowsheet Row Most Recent Value  Orbital Region Moderate depletion  Upper Arm Region Moderate depletion  Thoracic and Lumbar Region Severe depletion  Buccal  Region Moderate depletion  Temple Region Mild depletion  Clavicle Bone Region Severe depletion  Clavicle and Acromion Bone Region Severe depletion  Scapular Bone Region Moderate depletion  Dorsal Hand Severe depletion  Patellar Region Severe depletion  Anterior Thigh Region Severe depletion  Posterior Calf Region Severe depletion  Edema (RD Assessment) Mild  Hair Reviewed  Eyes Reviewed  Mouth Reviewed  Skin Reviewed  Nails Reviewed   Diet Order:   Diet Order             DIET - DYS 1 Room service appropriate? No; Fluid consistency: Thin  Diet effective now                  EDUCATION NEEDS:  Not appropriate for education at this time  Skin:  Skin Assessment: Reviewed RN Assessment  Last BM:  PTA  Height:  Ht Readings from Last 1 Encounters:  09/01/23 5\' 4"  (1.626 m)   Weight:  Wt Readings from Last 1 Encounters:  09/01/23 43.1 kg    Ideal Body Weight:  54.5 kg  BMI:  Body mass index is 16.31 kg/m.  Estimated Nutritional Needs:  Kcal:  1300-1500 kcal/d Protein:  60-80g/d Fluid:  >/=1.5L    Greig Castilla, RD, LDN Clinical Dietitian RD pager # available in AMION  After hours/weekend pager # available in [facility]

## 2023-09-03 NOTE — Assessment & Plan Note (Addendum)
Nutrition consultation in place.  Appreciate their recommendations.  SLP swallow study shows that she has no difficulty with swallowing but is refusing anything other than liquids. -regular diet as desired -not passing urine, added on IV fluids

## 2023-09-03 NOTE — Assessment & Plan Note (Addendum)
No ongoing seizure activity detected on EEG.  Continue to follow neurology's recommendations -Keppra twice daily -Vimpat once daily

## 2023-09-03 NOTE — Progress Notes (Addendum)
NEUROLOGY CONSULT FOLLOW UP NOTE   Date of service: September 03, 2023 Patient Name: Dawn [name] MRN:  [id number] DOB:  December 17, 1934  Brief HPI  Dawn [name] is a 88 y.o. female has a past medical history of diabetes, HTN, dementia who presents with altered mental status on 10/09. Her son went to do some errands around 1300 on the 9th and when he came back 2 hours later she has was confused and had difficulty walking. She lives with her son. In the ED, code stroke was activated. She was found to have right gaze deviation but spontaneously moving all extremities. She was not able to talk or follow commands.   CT head negative for ICH, but notable for extensive white matter disease.  CTA without LVO.  At CT she was noted to have rhythmic twitching around Right eye in addition to right gaze deviation and was given keppra with loading vimpat dose    Interval Hx/subjective   Patient grandson at bedside this morning. She does not interact but did open her eyes when conducting physical exam.   Vitals   Vitals:   09/02/23 2031 09/02/23 2323 09/03/23 0330 09/03/23 0905  BP: (!) 120/59 117/60 (!) 154/64 (!) 141/61  Pulse: 61 (!) 56 (!) 52 (!) 58  Resp: 16 16 16 14   Temp: 98.7 F (37.1 C) 98.4 F (36.9 C) 98.4 F (36.9 C) 98.7 F (37.1 C)  TempSrc: Axillary Axillary Axillary Oral  SpO2: 98% 97% 97% 96%  Weight:      Height:         Body mass index is 16.31 kg/m.  Physical Exam   Constitutional: Appears stated age Cardiovascular: Normal rate and regular rhythm.  Respiratory: Effort normal, non-labored breathing.  GI: Soft.  No distension. There is no tenderness.  Skin: WDI.   Neurologic Examination   Mental Status: Patient is staying with right ward gaze Cranial Nerves: II: Pupils equal, round, and reactive to light.   III,IV, VI, VIII: oculocephlatic reflex positive  V, VII: Corneal reflex intact  Labs and Diagnostic Imaging   CBC:  Recent Labs  Lab  09/01/23 1731 09/02/23 0118  WBC 4.7 5.3  NEUTROABS 3.3  --   HGB 12.0 13.7  HCT 37.4 42.5  MCV 93.7 91.8  PLT 252 275    Basic Metabolic Panel:     Latest Ref Rng & Units 09/02/2023    1:18 AM 09/01/2023    6:50 PM 02/09/2016    1:24 AM  BMP  Glucose 70 - 99 mg/dL 478  96  295   BUN 8 - 23 mg/dL 15  22  9    Creatinine 0.44 - 1.00 mg/dL 6.21  3.08  6.57   Sodium 135 - 145 mmol/L 137  139  140   Potassium 3.5 - 5.1 mmol/L 3.8  4.6  4.0   Chloride 98 - 111 mmol/L 98  97  101   CO2 22 - 32 mmol/L 26  27  28    Calcium 8.9 - 10.3 mg/dL 84.6  96.2  9.8     CT Head without contrast(Personally reviewed): IMPRESSION: 1. 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 Recommend MRI for further evaluation. Recommend with contrast exam given impression #2. 2. Somewhat conspicuous hypoattenuation within the white matter of the right greater than left frontoparietal lobes with possible extension to involve overlying right parietal cortex. While this could be in part due to chronic microvascular ischemic change, recommend MRI with contrast to exclude other  etiologies including ischemia or a mass.  CT angio Head and Neck with contrast(Personally reviewed): IMPRESSION: 1. No emergent large vessel occlusion. 2. Moderate left vertebral artery origin stenosis. 3. 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 While indeterminate, findings raise concern for thyroid cancer. Recommend ultrasound for further characterization and consideration of tissue sampling (ref: J Am Coll Radiol. 2015 Feb;12(2): 143-50). Additionally, the inferior extent of this mass is incompletely characterized and CT of the chest with contrast may be useful to fully evaluate.  MRI Brain(Personally reviewed): IMPRESSION: 1. Motion  degraded exam. 2. Extensive patchy and confluent T2/FLAIR hyperintensity involving both cerebral hemispheres, most pronounced posteriorly at the parietal lobes. Given the history of seizure, appearance is favored to reflect sequelae of pressed. 3. Single punctate focus of diffusion signal abnormality within the left cerebellum as above. While this is favored to be artifactual, a possible tiny ischemic infarct is difficult to exclude. 4. Innumerable punctate chronic micro hemorrhages involving both cerebral hemispheres, predominantly peripheral in location, most characteristic of cerebral amyloid angiopathy.  EEG:  10/10- Study is suggestive of moderate to severe diffuse encephalopathy. No seizures  Impression   Dawn [name] is a 88 y.o. female has a past medical history of diabetes, HTN, dementia who presents with altered mental status on 10/09. MRI is suggestive of PRES.  It will take several days for her to recover from this. I expect that she remains in the window where symptoms could be explained by PRES. We talked with her grandson at bedside about need to closely monitor blood pressures. MRI also notable for cerebral amyloid angiopathy which could explain some of baseline deficits.  Recommendations   - continue keppra 500 mg BID - gradual normotension ______________________________________________________________________   Thank you for the opportunity to take part in the care of this patient. If you have any further questions, please contact the neurology consultation team on call. Updated oncall schedule is listed on AMION.  Signed,  Memory Dance. Masters, D.O.  Internal Medicine Resident, PGY-3 [facility]  Pager: [phone] 1:50 PM, 09/03/2023    NEUROHOSPITALIST ADDENDUM Performed a face to face diagnostic evaluation.   I have reviewed the contents of history and physical exam as documented by PA/ARNP/Resident and agree with above  documentation.  I have discussed and formulated the above plan as documented. Edits to the note have been made as needed.  Impression/Key exam findings//Plan: still encephalopathic, wakes up with nares stimulation but does not follow commands. With her advanced age and comorbidities, reasonable to consider comfort care. Encephaloapthy could be explained by noted PRES on MRI and this may take a few days  to weeks to clear up. I would recommend continuing Keppra. Keppra is less likely to cause sedation. I have high suspicion that her presenting episode was concerning for focal status.  Erick Blinks, MD [facility] [phone]   If 7pm to 7am, please call on call as listed on AMION.

## 2023-09-03 NOTE — Plan of Care (Signed)
°  Problem: Education: Goal: Knowledge of General Education information will improve Description: Including pain rating scale, medication(s)/side effects and non-pharmacologic comfort measures Outcome: Progressing   Problem: Health Behavior/Discharge Planning: Goal: Ability to manage health-related needs will improve Outcome: Progressing   Problem: Clinical Measurements: Goal: Ability to maintain clinical measurements within normal limits will improve Outcome: Progressing Goal: Will remain free from infection Outcome: Progressing Goal: Diagnostic test results will improve Outcome: Progressing Goal: Respiratory complications will improve Outcome: Progressing Goal: Cardiovascular complication will be avoided Outcome: Progressing   Problem: Elimination: Goal: Will not experience complications related to bowel motility Outcome: Progressing Goal: Will not experience complications related to urinary retention Outcome: Progressing   Problem: Coping: Goal: Level of anxiety will decrease Outcome: Progressing   Problem: Safety: Goal: Ability to remain free from injury will improve Outcome: Progressing   Problem: Skin Integrity: Goal: Risk for impaired skin integrity will decrease Outcome: Progressing

## 2023-09-03 NOTE — Assessment & Plan Note (Addendum)
Patient remained asleep throughout evaluation this morning despite efforts to engage her.  After discussion yesterday family has been made at termination to have her DNR/DNI, palliative consult is in place to discuss further goals of care.  Blood cultures returned yesterday and were negative, blood alcohol level negative, still awaiting UDS to close out initial screening. -Neurology is on board, appreciate their recommendations -Palliative consult in place, awaiting recommendations -As needed hydralazine to keep BP between 130 and 150 systolic

## 2023-09-03 NOTE — TOC CM/SW Note (Signed)
Transition of Care [facility]) - Inpatient Brief Assessment   Patient Details  Name: Dawn [name] MRN: [id number] Date of Birth: 1935/09/26  Transition of Care [facility]) CM/SW Contact:    Baldemar Lenis, LCSW Phone Number: 09/03/2023, 3:21 PM   Clinical Narrative:   Patient from home with family, palliative medicine consult pending for goals of care. CSW to follow for any discharge needs when appropriate.    Transition of Care Asessment: Insurance and Status: Insurance coverage has been reviewed Patient has primary care physician: Yes Home environment has been reviewed: home with family Prior level of function:: needs assistance Prior/Current Home Services: No current home services Social Determinants of Health Reivew: SDOH reviewed no interventions necessary Readmission risk has been reviewed: Yes Transition of care needs: transition of care needs identified, TOC will continue to follow

## 2023-09-04 DIAGNOSIS — Z7189 Other specified counseling: Secondary | ICD-10-CM | POA: Diagnosis not present

## 2023-09-04 DIAGNOSIS — E639 Nutritional deficiency, unspecified: Secondary | ICD-10-CM | POA: Diagnosis not present

## 2023-09-04 DIAGNOSIS — E440 Moderate protein-calorie malnutrition: Secondary | ICD-10-CM | POA: Insufficient documentation

## 2023-09-04 DIAGNOSIS — R569 Unspecified convulsions: Secondary | ICD-10-CM | POA: Diagnosis not present

## 2023-09-04 DIAGNOSIS — R4182 Altered mental status, unspecified: Secondary | ICD-10-CM | POA: Diagnosis not present

## 2023-09-04 MED ORDER — DEXTROSE-SODIUM CHLORIDE 5-0.9 % IV SOLN: INTRAVENOUS | Status: DC

## 2023-09-04 NOTE — Assessment & Plan Note (Addendum)
SLP swallow study showed no difficulty with swallowing but patient continues to refuse anything other than liquids. -Dysphagia 1 diet, allowed to eat as patient desires -Continue IV fluids pending family goals of care discussion.

## 2023-09-04 NOTE — Progress Notes (Signed)
Daily Progress Note   Patient Name: Dawn [name]       Date: 09/04/2023 DOB: 11-15-1935  Age: 88 y.o. MRN#: [id number] Attending Physician: Doreene Eland, MD Primary Care Physician: Billee Cashing, MD Admit Date: 09/01/2023  Reason for Consultation/Follow-up: Establishing goals of care  Subjective: Patient is awake in bed eating with assistance. She is in NAD. Nursing staff at bedside no family at bedside.  Length of Stay: 3  Current Medications: Scheduled Meds:   feeding supplement  237 mL Oral TID BM    Continuous Infusions:  dextrose 5 % and 0.9 % NaCl     levETIRAcetam 500 mg (09/04/23 0817)    PRN Meds: hydrALAZINE, mouth rinse  Physical Exam Vitals reviewed.  HENT:     Head: Normocephalic and atraumatic.  Cardiovascular:     Rate and Rhythm: Bradycardia present.  Pulmonary:     Effort: Pulmonary effort is normal.  Neurological:     Mental Status: She is alert.  Psychiatric:        Mood and Affect: Mood normal.             Vital Signs: BP (!) 148/66 (BP Location: Left Arm)   Pulse (!) 57   Temp 97.7 F (36.5 C) (Oral)   Resp 15   Ht 5\' 4"  (1.626 m)   Wt 51.7 kg   SpO2 98%   BMI 19.56 kg/m  SpO2: SpO2: 98 % O2 Device: O2 Device: Room Air O2 Flow Rate:    Intake/output summary:  Intake/Output Summary (Last 24 hours) at 09/04/2023 1426 Last data filed at 09/04/2023 0900 Gross per 24 hour  Intake 1766.82 ml  Output 450 ml  Net 1316.82 ml   LBM: Last BM Date :  (pta) Baseline Weight: Weight: 43.1 kg Most recent weight: Weight: 51.7 kg        Patient Active Problem List   Diagnosis Date Noted   Malnutrition of moderate degree 09/04/2023   Focal seizure (HCC) 09/02/2023   Poor nutrition 09/02/2023   Thyroid mass 09/02/2023   AMS  (altered mental status) 09/01/2023   SBO (small bowel obstruction) (HCC) 11/14/2012    Palliative Care Assessment & Plan   Patient Profile: 88 y.o. female  with past medical history of hypertension, diabetes, hyperlipidemia, and asthma admitted on 09/01/2023 with a change  in mental status and seizure (witnessed in ED).   Assessment: Nursing reports patient ate her entire breakfast and was eating her lunch with assistance when I saw her.    1400: Called patient's son Earl Lites. Missed him and his sister when they visited this morning. Earl Lites shares that hey are encouraged by her improvement over the last two days-- especially since she ate her entire breakfast this morning. He is hopeful that she will continue to increase her oral intake.   We discussed her thyroid mass. Earl Lites continues to believe his mother would not want diagnostic testing for this as he does not think she would want any treatment/ therapy that might be offered. We discussed how next steps for the patient will depend on her goals of care-- whether she would want a treatment based path or comfort based path.  Discussed the importance of continued conversation with family and the medical providers regarding overall plan of care and treatment options, ensuring decisions are within the context of the patient's values and GOCs.   Earl Lites states he will let me know a time his sister can meet.   Recommendations/Plan: DNR/DNI Limited scope of care Encouraged the family to continue discussing goals of care for patient Family meeting this weekend- Time TBD Continued PMT support   Code Status:    Code Status Orders  (From admission, onward)           Start     Ordered   09/02/23 1308  Do not attempt resuscitation (DNR)- Limited -Do Not Intubate (DNI)  (Code Status)  Continuous       Question Answer Comment  If pulseless and not breathing No CPR or chest compressions.   In Pre-Arrest Conditions (Patient Is Breathing  and Has A Pulse) Do not intubate. Provide all appropriate non-invasive medical interventions. Avoid ICU transfer unless indicated or required.   Consent: Discussion documented in EHR or advanced directives reviewed      09/02/23 1307          Care plan was discussed with beside RN  Time spent: 35 minutes  Thank you for allowing the Palliative Medicine Team to assist in the care of this patient.     Sherryll Burger, NP  Please contact Palliative Medicine Team phone at [phone] for questions and concerns.

## 2023-09-04 NOTE — Assessment & Plan Note (Addendum)
Patient awake with a social smile response to verbal stimuli. Family plans to continue discussion with Palliative today or tomorrow to discuss further goals of care. Still awaiting UDS to close out initial screening. -Neurology is on board, appreciate their recommendations -Palliative consult in place, awaiting recommendations -As needed hydralazine to keep BP between 130 and 150 systolic

## 2023-09-04 NOTE — Plan of Care (Signed)
  Problem: Education: Goal: Knowledge of General Education information will improve Description Including pain rating scale, medication(s)/side effects and non-pharmacologic comfort measures Outcome: Progressing   

## 2023-09-04 NOTE — Progress Notes (Signed)
     Daily Progress Note Intern Pager: [phone]  Patient name: Dawn [name] Medical record number: [id number] Date of birth: Feb 20, 1935 Age: 88 y.o. Gender: female  Primary Care Provider: Billee Cashing, MD Consultants: Palliative, Neuro Code Status: DNR/DNI   Pt Overview and Major Events to Date:  10/8: Admitted 10/9: Now DNR/DNI  Assessment and Plan: Dawn [name] is an 88 year old lady with a history of dementia, hypertension, diabetes, hyperlipidemia, and depression presenting with acute mental status changes with suspected PRES.  Palliative has spoken with the family about goals of care and plan to continue conversation this weekend with all of patient's 3 children, who are her decision-makers.   Assessment & Plan AMS (altered mental status) Patient awake with a social smile response to verbal stimuli. Family plans to continue discussion with Palliative today or tomorrow to discuss further goals of care. Still awaiting UDS to close out initial screening. -Neurology is on board, appreciate their recommendations -Palliative consult in place, awaiting recommendations -As needed hydralazine to keep BP between 130 and 150 systolic Poor nutrition SLP swallow study showed no difficulty with swallowing but patient continues to refuse anything other than liquids. -Dysphagia 1 diet, allowed to eat as patient desires -Continue IV fluids pending family goals of care discussion.   Chronic and Stable Problems:  Focal seizure: No ongoing seizure activity on EEG.  Per neuro, continue Keppra 500 mg twice daily. Hypertension: Holding home medications.  Hydralazine, as above. Diabetes: Holding home medications, discontinued CBGs. HLD: Holding home meds Depression: Holding home meds in setting of altered mental status Thyroid mass: Further workup pending goals of care discussion with family   FEN/GI: Dysphagia 1 PPx: None Dispo: Pending goals of care discussion with  family.  Subjective:  Patient seen this morning lying in bed awake, comfortable appearing.  She has social smile to verbal stimuli, but does not follow directions.  Objective: Temp:  [97.6 F (36.4 C)-98.7 F (37.1 C)] 97.7 F (36.5 C) (10/11 2327) Pulse Rate:  [51-61] 55 (10/11 2327) Resp:  [14-16] 14 (10/11 2327) BP: (127-164)/(51-73) 147/73 (10/11 2327) SpO2:  [95 %-99 %] 97 % (10/11 2327) Weight:  [51.7 kg] 51.7 kg (10/11 1601) Physical Exam: General: Awake, lying in bed, comfortable appearing.  No acute distress.  Cardiovascular: RRR, no murmurs Respiratory: CTA bilaterally, no increased work of breathing Abdomen: Hypoactive bowel sounds, soft, nondistended Extremities: 2+ pulses in bilateral upper and lower extremities.  Skin warm and dry.  Laboratory: Most recent CBC Lab Results  Component Value Date   WBC 5.3 09/02/2023   HGB 13.7 09/02/2023   HCT 42.5 09/02/2023   MCV 91.8 09/02/2023   PLT 275 09/02/2023   Most recent BMP    Latest Ref Rng & Units 09/02/2023    1:18 AM  BMP  Glucose 70 - 99 mg/dL 403   BUN 8 - 23 mg/dL 15   Creatinine 4.74 - 1.00 mg/dL 2.59   Sodium 563 - 875 mmol/L 137   Potassium 3.5 - 5.1 mmol/L 3.8   Chloride 98 - 111 mmol/L 98   CO2 22 - 32 mmol/L 26   Calcium 8.9 - 10.3 mg/dL 64.3     Cyndia Skeeters, DO 09/04/2023, 2:10 AM  PGY-1, [facility] FPTS Intern pager: [phone], text pages welcome Secure chat group [facility] Teaching Service

## 2023-09-05 DIAGNOSIS — I6783 Posterior reversible encephalopathy syndrome: Secondary | ICD-10-CM | POA: Diagnosis not present

## 2023-09-05 DIAGNOSIS — R4182 Altered mental status, unspecified: Secondary | ICD-10-CM | POA: Diagnosis not present

## 2023-09-05 DIAGNOSIS — E639 Nutritional deficiency, unspecified: Secondary | ICD-10-CM | POA: Diagnosis not present

## 2023-09-05 DIAGNOSIS — R569 Unspecified convulsions: Secondary | ICD-10-CM | POA: Diagnosis not present

## 2023-09-05 DIAGNOSIS — Z515 Encounter for palliative care: Secondary | ICD-10-CM | POA: Diagnosis not present

## 2023-09-05 DIAGNOSIS — I10 Essential (primary) hypertension: Secondary | ICD-10-CM | POA: Insufficient documentation

## 2023-09-05 DIAGNOSIS — Z7189 Other specified counseling: Secondary | ICD-10-CM | POA: Diagnosis not present

## 2023-09-05 DIAGNOSIS — E079 Disorder of thyroid, unspecified: Secondary | ICD-10-CM | POA: Diagnosis not present

## 2023-09-05 DIAGNOSIS — E440 Moderate protein-calorie malnutrition: Secondary | ICD-10-CM | POA: Insufficient documentation

## 2023-09-05 LAB — CBC
HCT: 40.7 % (ref 36.0–46.0)
Hemoglobin: 13 g/dL (ref 12.0–15.0)
MCH: 29.1 pg (ref 26.0–34.0)
MCHC: 31.9 g/dL (ref 30.0–36.0)
MCV: 91.3 fL (ref 80.0–100.0)
Platelets: 273 10*3/uL (ref 150–400)
RBC: 4.46 MIL/uL (ref 3.87–5.11)
RDW: 12.5 % (ref 11.5–15.5)
WBC: 4.3 10*3/uL (ref 4.0–10.5)
nRBC: 0 % (ref 0.0–0.2)

## 2023-09-05 LAB — BASIC METABOLIC PANEL
Anion gap: 8 (ref 5–15)
BUN: 15 mg/dL (ref 8–23)
CO2: 28 mmol/L (ref 22–32)
Calcium: 9.9 mg/dL (ref 8.9–10.3)
Chloride: 102 mmol/L (ref 98–111)
Creatinine, Ser: 0.8 mg/dL (ref 0.44–1.00)
GFR, Estimated: >60 mL/min (ref >60)
Glucose, Bld: 158 mg/dL — ABNORMAL HIGH (ref 70–99)
Potassium: 3.3 mmol/L — ABNORMAL LOW (ref 3.5–5.1)
Sodium: 138 mmol/L (ref 135–145)

## 2023-09-05 MED ORDER — POTASSIUM CHLORIDE 20 MEQ PO PACK
80.0000 meq | PACK | Freq: Once | ORAL | Status: AC
  Administered 2023-09-05: 80 meq via ORAL
  Filled 2023-09-05: qty 4

## 2023-09-05 MED ORDER — VALSARTAN-HYDROCHLOROTHIAZIDE 160-12.5 MG PO TABS: 1.0000 | ORAL_TABLET | Freq: Every day | ORAL | Status: DC

## 2023-09-05 MED ORDER — IRBESARTAN 150 MG PO TABS
150.0000 mg | ORAL_TABLET | Freq: Every day | ORAL | Status: DC
  Administered 2023-09-05 – 2023-09-07 (×3): 150 mg via ORAL
  Filled 2023-09-05 (×3): qty 1

## 2023-09-05 MED ORDER — LEVETIRACETAM 500 MG PO TABS
500.0000 mg | ORAL_TABLET | Freq: Two times a day (BID) | ORAL | Status: DC
  Administered 2023-09-05 – 2023-09-07 (×5): 500 mg via ORAL
  Filled 2023-09-05 (×5): qty 1

## 2023-09-05 NOTE — Progress Notes (Addendum)
Daily Progress Note   Patient Name: Dawn [name]       Date: 09/05/2023 DOB: 1935/01/01  Age: 88 y.o. MRN#: [id number] Attending Physician: Doreene Eland, MD Primary Care Physician: Billee Cashing, MD Admit Date: 09/01/2023  Reason for Consultation/Follow-up: Establishing goals of care  Subjective: Patient is awake in bed with family and nursing at bedside. She reports she is doing good today.. She is in NAD.   Length of Stay: 4  Current Medications: Scheduled Meds:   feeding supplement  237 mL Oral TID BM    Continuous Infusions:  dextrose 5 % and 0.9 % NaCl 75 mL/hr at 09/04/23 1900   levETIRAcetam 500 mg (09/05/23 0843)    PRN Meds: hydrALAZINE, mouth rinse  Physical Exam Vitals reviewed.  HENT:     Head: Normocephalic and atraumatic.  Cardiovascular:     Rate and Rhythm: Normal rate.  Pulmonary:     Effort: Pulmonary effort is normal.  Neurological:     Mental Status: She is alert.  Psychiatric:        Mood and Affect: Mood normal.             Vital Signs: BP (!) 186/87 (BP Location: Left Arm)   Pulse 71   Temp 98.5 F (36.9 C) (Oral)   Resp 18   Ht 5\' 4"  (1.626 m)   Wt 51.7 kg   SpO2 100%   BMI 19.56 kg/m  SpO2: SpO2: 100 % O2 Device: O2 Device: Room Air O2 Flow Rate:    Intake/output summary:  Intake/Output Summary (Last 24 hours) at 09/05/2023 1112 Last data filed at 09/05/2023 0900 Gross per 24 hour  Intake 1769.01 ml  Output 800 ml  Net 969.01 ml   LBM: Last BM Date : 09/04/23 Baseline Weight: Weight: 43.1 kg Most recent weight: Weight: 51.7 kg        Patient Active Problem List   Diagnosis Date Noted   Hypertension 09/05/2023   Focal seizure (HCC) 09/02/2023   Poor nutrition 09/02/2023   Thyroid mass 09/02/2023   AMS  (altered mental status) 09/01/2023   SBO (small bowel obstruction) (HCC) 11/14/2012    Palliative Care Assessment & Plan   Patient Profile: 88 y.o. female  with past medical history of hypertension, diabetes, hyperlipidemia, and asthma admitted on 09/01/2023 with a  change in mental status and seizure (witnessed in ED).   Assessment: Patient reports she ate her entire breakfast. She is more alert and oriented today than previous days.   1030: Met with patient's children Earl Lites and Aram Beecham. Patient's family face treatment option decisions, advanced directive decisions, and anticipatory care needs. We discussed her hospital course. We discussed her improved oral intake and improvement in mental status.   We discussed the patient's thyroid mass. They believe their mother would not want diagnostic testing or any treatment/ therapy that might be offered. We discussed how next steps for the patient will depend on her goals of care-- whether she would want a treatment based path or comfort based path. We discussed both pathways. The family believes a comfort based path would most closely match the patient's goals of care.  We discussed what home hospice would look like for this patient. We discussed that the focus of care would shift from treatment to comfort. They are familiar with hospice services. Explained that I would consult Transition of Care team to get the process started.  Family have no questions or concerns at this time. Encouraged them to call PMT with needs.  Recommendations/Plan: DNR/DNI Limited scope of care: Optimize while hospitalized Plan to discharge home with home hospice care-- TOC order placed Continued PMT support   Code Status:    Code Status Orders  (From admission, onward)           Start     Ordered   09/02/23 1308  Do not attempt resuscitation (DNR)- Limited -Do Not Intubate (DNI)  (Code Status)  Continuous       Question Answer Comment  If pulseless and  not breathing No CPR or chest compressions.   In Pre-Arrest Conditions (Patient Is Breathing and Has A Pulse) Do not intubate. Provide all appropriate non-invasive medical interventions. Avoid ICU transfer unless indicated or required.   Consent: Discussion documented in EHR or advanced directives reviewed      09/02/23 1307         Extensive chart review has been completed prior to seeing the patient and speaking with her children including labs, vital signs, imagine, progress/consult notes, orders, medications, and available advance directive documents.  Care plan was discussed with beside RN and attending team  Time spent: 50 minutes  Thank you for allowing the Palliative Medicine Team to assist in the care of this patient.     Sherryll Burger, NP  Please contact Palliative Medicine Team phone at [phone] for questions and concerns.

## 2023-09-05 NOTE — Assessment & Plan Note (Addendum)
Palliative meeting this morning decided on home with home hospice.  Awake and talking today. -Plan for home with home hospice.  Need to get DME orders set up for her.

## 2023-09-05 NOTE — Progress Notes (Signed)
NEUROLOGY CONSULT FOLLOW UP NOTE   Date of service: September 05, 2023 Patient Name: Dawn [name] MRN:  [id number] DOB:  [birth year]  Brief HPI  Dawn [name] is a 88 y.o. female has a past medical history of DM2, HTN, dementia who presented with altered mental status on 10/09. Her son went to do some errands around 13:00 on the 9th and when he came back 2 hours later she has was confused and had difficulty walking. She lives with her son. In the ED, code stroke was activated. She was found to have right gaze deviation but spontaneously moving all extremities. She was not able to talk or follow commands.   CT head negative for ICH, but notable for extensive white matter disease.  CTA without LVO.  At CT she was noted to have rhythmic twitching around Right eye in addition to right gaze deviation and was given keppra with loading vimpat dose    Interval Hx/subjective   No family is present at bedside this morning.  Bedside RN states that patient attempts to ambulate without assistance and crawl out of bed.  Patient's alertness improved today as she is sitting up and interacting with examiner upon entering the patient's room.  Vitals   Vitals:   09/04/23 2345 09/05/23 0419 09/05/23 0811 09/05/23 1201  BP: (!) 158/87 (!) 172/75 (!) 186/87 136/82  Pulse: 60 61 71 84  Resp: 18 18 18 19   Temp: 98.2 F (36.8 C) 98 F (36.7 C) 98.5 F (36.9 C) 98.7 F (37.1 C)  TempSrc: Oral Axillary Oral Oral  SpO2: 99% 99% 100% 98%  Weight:      Height:        Body mass index is 19.56 kg/m.  Physical Exam   Constitutional: Awake, alert, sitting up in bed drinking from a straw on examiner initial evaluation. NAD. Respiratory: Effort normal, non-labored breathing on room air Skin: WDI.   Neurologic Examination   Mental Status: Awake, alert to self.  She is unable to state her age, the current month, year, or place.  Patient has been repetitive speech, echolalia, and a frequent sing-song  pattern of communication. She states that [place] in [place] but says "no" when asked if she is at home and if she is in the hospital. When asked the year she repeats "brick building" four times with examiner re-questioning. The fifth time patient states "19-brick". When asked the current month she states "brick, 19-brick" and does not answer this question even with repeat questioning from examiner.  Speech is not dysarthric and she does appear to be aphasic.  She follows simple commands with repeat instruction.  Poor attention and concentration noted. Cranial Nerves: PERRL, EOMI without gaze preference, VFF, hearing is intact to voice, face is symmetric resting and with movement, head is grossly midline, tongue protrudes midline. Coordination: Patient is unable to perform multi-step commands Motor/Sensory: Patient elevates bilateral upper extremity and without vertical drift.  There is slight elevation of the left upper extremity due to complaints of left shoulder pain with elevation.  Extremity.  There is no unilateral weakness or asymmetry throughout lower extremities. Gait: Deferred   Labs and Diagnostic Imaging   CBC:  Recent Labs  Lab 09/01/23 1731 09/02/23 0118 09/05/23 1024  WBC 4.7 5.3 4.3  NEUTROABS 3.3  --   --   HGB 12.0 13.7 13.0  HCT 37.4 42.5 40.7  MCV 93.7 91.8 91.3  PLT 252 275 273   Basic Metabolic Panel:  Latest Ref Rng & Units 09/05/2023   10:24 AM 09/02/2023    1:18 AM 09/01/2023    6:50 PM  BMP  Glucose 70 - 99 mg/dL 161  096  96   BUN 8 - 23 mg/dL 15  15  22    Creatinine 0.44 - 1.00 mg/dL 0.45  4.09  8.11   Sodium 135 - 145 mmol/L 138  137  139   Potassium 3.5 - 5.1 mmol/L 3.3  3.8  4.6   Chloride 98 - 111 mmol/L 102  98  97   CO2 22 - 32 mmol/L 28  26  27    Calcium 8.9 - 10.3 mg/dL 9.9  91.4  78.2     CT Head without contrast (Personally reviewed): 1. 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 Recommend MRI for further evaluation.  Recommend with contrast exam given impression #2. 2. Somewhat conspicuous hypoattenuation within the white matter of the right greater than left frontoparietal lobes with possible extension to involve overlying right parietal cortex. While this could be in part due to chronic microvascular ischemic change, recommend MRI with contrast to exclude other etiologies including ischemia or a mass.  CT angio Head and Neck with contrast (Personally reviewed): 1. No emergent large vessel occlusion. 2. Moderate left vertebral artery origin stenosis. 3. 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 While indeterminate, findings raise concern for thyroid cancer. Recommend ultrasound for further characterization and consideration of tissue sampling (ref: J Am Coll Radiol. 2015 Feb;12(2): 143-50). Additionally, the inferior extent of this mass is incompletely characterized and CT of the chest with contrast may be useful to fully evaluate.  MRI Brain(Personally reviewed): 1. Motion degraded exam. 2. Extensive patchy and confluent T2/FLAIR hyperintensity involving both cerebral hemispheres, most pronounced posteriorly at the parietal lobes. Given the history of seizure, appearance is favored to reflect sequelae of pressed. 3. Single punctate focus of diffusion signal abnormality within the left cerebellum as above. While this is favored to be artifactual, a possible tiny ischemic infarct is difficult to exclude. 4. Innumerable punctate chronic micro hemorrhages involving both cerebral hemispheres, predominantly peripheral in location, most characteristic of cerebral amyloid angiopathy.  EEG 10/10: Study is suggestive of moderate to severe diffuse encephalopathy. No seizures  Impression   Dawn [name] is a 88 y.o.  female has a past medical history of DM2, HTN, dementia who presents with AMS on 10/09. MRI is suggestive of PRES.  It will take several days for her to recover from this. I expect that she remains in the window where symptoms could be explained by PRES with gradual improvement since presentation. We talked with her grandson at bedside about need to closely monitor blood pressures. MRI also notable for cerebral amyloid angiopathy which could explain some of baseline deficits.  Recommendations   - continue keppra 500 mg BID - gradual normotension, management per primary team - No further neurology recommendations at this time as patient's examination improves with blood pressure management ______________________________________________________________________   Thank you for the opportunity to take part in the care of this patient. If you have any further questions, please contact the neurology consultation team on call. Updated oncall schedule is listed on AMION.  Signed,  Lanae Boast, AGACNP-BC [facility] Pager: [phone]  NEUROHOSPITALIST ADDENDUM Performed a face to face diagnostic evaluation.   I  have reviewed the contents of history and physical exam as documented by PA/ARNP/Resident and agree with above documentation.  I have discussed and formulated the above plan as documented. Edits to the note have been made as needed.  Impression/Key exam findings/Plan: wakeful, slightly confused but follows commands. Continue Keppra 500 BID for a couple months and then stop. Follow up with neurology outpatinet. We will signoff.  Erick Blinks, MD [facility] [phone]   If 7pm to 7am, please call on call as listed on AMION.

## 2023-09-05 NOTE — TOC Initial Note (Signed)
Transition of Care [facility]) - Initial/Assessment Note    Patient Details  Name: Dawn [name] MRN: [id number] Date of Birth: Jul 13, 1935  Transition of [facility]) CM/SW Contact:    Lawerance Sabal, RN Phone Number: 09/05/2023, 11:55 AM  Clinical Narrative:                  Notified by Palliative Team that patient/ family seeking home hospice.  Spoke w son Tammy Sours over the phone who is agreeable to home hospice for patient. Discussed hospice providers and he states they live close to [facility] so they would like to use [facility].  Tammy Sours states that his sister Aram Beecham lives with the patient and will be able to provide care.  We identified that the patient will need ambulance for DC, home address verified with Tammy Sours.  Tammy Sours would like hospital bed and will be deconstructing the patient home bed today to make room for one.   I have made referral to Thea Gist w [facility] and provided all information noted above.   Expected Discharge Plan: Home w Hospice Care Barriers to Discharge: Equipment Delay   Patient Goals and CMS Choice Patient states their goals for this hospitalization and ongoing recovery are:: return home w home hospice (Son Tammy Sours) CMS Medicare.gov Compare Post Acute Care list provided to:: Other (Comment Required) Choice offered to / list presented to : Adult Children (son Tammy Sours)      Expected Discharge Plan and Services   Discharge Planning Services: CM Consult Post Acute Care Choice: Hospice Living arrangements for the past 2 months: Single Family Home                             [facility] Agency: [facility] [place] Date [facility] Agency Contacted: 09/05/23 Time HH Agency Contacted: 1154 Representative spoke with at [facility] Agency: Thea Gist  Prior Living Arrangements/Services Living arrangements for the past 2 months: Single Family Home Lives with:: Adult Children                   Activities of Daily Living   ADL Screening (condition at time of  admission) Independently performs ADLs?: Yes (appropriate for developmental age) Is the patient deaf or have difficulty hearing?: No Does the patient have difficulty seeing, even when wearing glasses/contacts?: Yes Does the patient have difficulty concentrating, remembering, or making decisions?: Yes  Permission Sought/Granted                  Emotional Assessment              Admission diagnosis:  Focal seizure (HCC) [R56.9] Poor nutrition [E63.9] Thyroid mass [E07.9] Altered mental status, unspecified altered mental status type [R41.82] AMS (altered mental status) [R41.82] Patient Active Problem List   Diagnosis Date Noted   Hypertension 09/05/2023   Focal seizure (HCC) 09/02/2023   Poor nutrition 09/02/2023   Thyroid mass 09/02/2023   AMS (altered mental status) 09/01/2023   SBO (small bowel obstruction) (HCC) 11/14/2012   PCP:  Billee Cashing, MD Pharmacy:   [facility] [phone] [place], [place] - [address] [place] [address] Phone: [phone] Fax: [phone]     Social Determinants of Health (SDOH) Social History: SDOH Screenings   Food Insecurity: No Food Insecurity (09/02/2023)  Housing: Low Risk  (09/02/2023)  Transportation Needs: No Transportation Needs (09/02/2023)  Utilities: Not At Risk (09/02/2023)  Tobacco Use: Medium Risk (09/01/2023)  SDOH Interventions:     Readmission Risk Interventions     No data to display

## 2023-09-05 NOTE — Assessment & Plan Note (Addendum)
Stable.  No further seizure-like activity -Transitioned IV levetiracetam to p.o. levetiracetam 500 mg every 12 hours

## 2023-09-05 NOTE — Assessment & Plan Note (Addendum)
May eat dysphagia 1 diet as desired. -Discontinue IV fluids -Monitor p.o. intake -Ensure is ordered 3 times daily between meals -RD following appreciate their input

## 2023-09-05 NOTE — Progress Notes (Addendum)
Daily Progress Note Intern Pager: [phone]  Patient name: Dawn [name] Medical record number: [id number] Date of birth: 07-10-1935 Age: 88 y.o. Gender: female  Primary Care Provider: Billee Cashing, MD Consultants: Palliative Medicine Code Status: DNR/DNI  Pt Overview and Major Events to Date:  10/8: Admitted 10/9: CODE STATUS discussed with family, patient is DNR/DNI.  Palliative consulted. 10/12: Showing improvement in mentation.  Assessment and Plan: Dawn [name] is an 88 year old female admitted with altered mentation, with concern for suspected PRES versus seizure-like activity. Having slow improvement.  Still on IV fluids with poor p.o. intake. Family and patient in agreement with discharging home with home hospice.  Pertinent PMH/PSH includes dementia, hypertension, T2DM, hyperlipidemia.  Assessment & Plan AMS (altered mental status) Palliative meeting this morning decided on home with home hospice.  Awake and talking today. -Plan for home with home hospice.  Need to get DME orders set up for her. Poor nutrition May eat dysphagia 1 diet as desired. -Discontinue IV fluids -Monitor p.o. intake -Ensure is ordered 3 times daily between meals -RD following appreciate their input Focal seizure (HCC) Stable.  No further seizure-like activity -Transitioned IV levetiracetam to p.o. levetiracetam 500 mg every 12 hours Hypertension Elevated blood pressures continue, although wide pulse pressure -Transitioned to some of her home regimen.  Started valsartan (formulary that we have here is the irbesartan) 150 mg daily. 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  Very clear right tracheal and esophageal deviation seen.  High concern for possible thyroid malignancy.  Fortunately, no airway compromise. -Through discussion, family has opted to not pursue  further workup or management of mass.  Chronic and Stable Problems: T2DM: Continuing to hold home medications (Glipizide 10 mg daily, metformin 500 mg daily) in light of low p.o. intake, monitoring CBG with morning labs.  However, would not resume glipizide given risk of hypoglycemia  FEN/GI: Dysphagia 1 diet, assistance with meals.   PPx: SCDs Dispo: Pending further goals of care discussion  Subjective:  Patient sitting up comfortably in the bed.  Son and daughter in the room.  Spirits are high.  Patient smiles and says that she is feeling happy.  She is ready to go home.  No questions or concerns from patient or family.  They had a good discussion with palliative medicine this morning  Objective: Temp:  [97.7 F (36.5 C)-98.6 F (37 C)] 98 F (36.7 C) (10/13 0419) Pulse Rate:  [57-70] 61 (10/13 0419) Resp:  [15-18] 18 (10/13 0419) BP: (148-190)/(64-92) 172/75 (10/13 0419) SpO2:  [96 %-99 %] 99 % (10/13 0419) Physical Exam: General: Elderly female, sitting upright in the bed, comfortably awake and alert Cardiovascular: RRR Respiratory: Normal WOB ORA Abdomen: Soft, NTND Extremities: Warm, well perfused Neuro: Answers questions, speech is comprehendible  Laboratory: Most recent CBC Lab Results  Component Value Date   WBC 5.3 09/02/2023   HGB 13.7 09/02/2023   HCT 42.5 09/02/2023   MCV 91.8 09/02/2023   PLT 275 09/02/2023   Most recent BMP    Latest Ref Rng & Units 09/02/2023    1:18 AM  BMP  Glucose 70 - 99 mg/dL 440   BUN 8 - 23 mg/dL 15   Creatinine 3.47 - 1.00 mg/dL 4.25   Sodium 956 - 387 mmol/L 137   Potassium 3.5 - 5.1 mmol/L 3.8   Chloride 98 - 111 mmol/L 98  CO2 22 - 32 mmol/L 26   Calcium 8.9 - 10.3 mg/dL 95.6      Darral Dash, DO 09/05/2023, 7:36 AM  PGY-3, [facility] Health Family Medicine FPTS Intern pager: [phone], text pages welcome Secure chat group [facility] Teaching Service

## 2023-09-05 NOTE — Plan of Care (Signed)
  Problem: Education: Goal: Knowledge of General Education information will improve Description Including pain rating scale, medication(s)/side effects and non-pharmacologic comfort measures Outcome: Progressing   

## 2023-09-05 NOTE — Assessment & Plan Note (Addendum)
Elevated blood pressures continue, although wide pulse pressure -Transitioned to some of her home regimen.  Started valsartan (formulary that we have here is the irbesartan) 150 mg daily. -Monitor blood pressures

## 2023-09-05 NOTE — Assessment & Plan Note (Addendum)
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  Very clear right tracheal and esophageal deviation seen.  High concern for possible thyroid malignancy.  Fortunately, no airway compromise. -Through discussion, family has opted to not pursue further workup or management of mass.

## 2023-09-05 NOTE — Progress Notes (Signed)
[facility] [phone] [facility] Liaison RN note  Received request from [facility] for hospice services at home after discharge. Chart and patient information under review by Hospice physician.   Spoke with son Tammy Sours to initiate education related to hospice philosophy, services, and team approach to care. Patient/family verbalized understanding of information given.   Per discussion, the plan is for discharge home by ambulance transport likely Tuesday.  DME needs discussed. Patient has no immediate DME needs.  Please send signed and completed DNR with patient/family.  Please provide prescriptions at discharge as needed for ongoing symptom management.   Please call with any hospice related questions or concerns.  Thank you for the opportunity to participate in this patient's care.  Thea Gist, Charity fundraiser, BSN [employer] [phone]

## 2023-09-06 ENCOUNTER — Other Ambulatory Visit (HOSPITAL_COMMUNITY): Payer: Self-pay

## 2023-09-06 DIAGNOSIS — E079 Disorder of thyroid, unspecified: Secondary | ICD-10-CM | POA: Diagnosis not present

## 2023-09-06 DIAGNOSIS — R569 Unspecified convulsions: Secondary | ICD-10-CM | POA: Diagnosis not present

## 2023-09-06 DIAGNOSIS — R4182 Altered mental status, unspecified: Secondary | ICD-10-CM | POA: Diagnosis not present

## 2023-09-06 DIAGNOSIS — Z7189 Other specified counseling: Secondary | ICD-10-CM | POA: Diagnosis not present

## 2023-09-06 DIAGNOSIS — E639 Nutritional deficiency, unspecified: Secondary | ICD-10-CM | POA: Diagnosis not present

## 2023-09-06 DIAGNOSIS — Z515 Encounter for palliative care: Secondary | ICD-10-CM | POA: Diagnosis not present

## 2023-09-06 LAB — BASIC METABOLIC PANEL
Anion gap: 9 (ref 5–15)
BUN: 16 mg/dL (ref 8–23)
CO2: 27 mmol/L (ref 22–32)
Calcium: 9.7 mg/dL (ref 8.9–10.3)
Chloride: 103 mmol/L (ref 98–111)
Creatinine, Ser: 0.67 mg/dL (ref 0.44–1.00)
GFR, Estimated: >60 mL/min (ref >60)
Glucose, Bld: 88 mg/dL (ref 70–99)
Potassium: 4.4 mmol/L (ref 3.5–5.1)
Sodium: 139 mmol/L (ref 135–145)

## 2023-09-06 LAB — CBC
HCT: 38.3 % (ref 36.0–46.0)
Hemoglobin: 12.5 g/dL (ref 12.0–15.0)
MCH: 30.3 pg (ref 26.0–34.0)
MCHC: 32.6 g/dL (ref 30.0–36.0)
MCV: 92.7 fL (ref 80.0–100.0)
Platelets: 225 10*3/uL (ref 150–400)
RBC: 4.13 MIL/uL (ref 3.87–5.11)
RDW: 12.7 % (ref 11.5–15.5)
WBC: 3.6 10*3/uL — ABNORMAL LOW (ref 4.0–10.5)
nRBC: 0 % (ref 0.0–0.2)

## 2023-09-06 MED ORDER — LEVETIRACETAM 500 MG PO TABS
500.0000 mg | ORAL_TABLET | Freq: Two times a day (BID) | ORAL | 0 refills | Status: DC
  Filled 2023-09-06: qty 60, 30d supply, fill #0

## 2023-09-06 MED ORDER — IRBESARTAN 150 MG PO TABS
150.0000 mg | ORAL_TABLET | Freq: Every day | ORAL | 0 refills | Status: AC
  Filled 2023-09-06: qty 30, 30d supply, fill #0

## 2023-09-06 MED ORDER — ENSURE ENLIVE PO LIQD: 237.0000 mL | Freq: Three times a day (TID) | ORAL | Status: AC

## 2023-09-06 NOTE — Assessment & Plan Note (Signed)
May eat dysphagia 1 diet as desired. IVF have been discontinued. -Monitor p.o. intake -Ensure is ordered 3 times daily between meals -RD following appreciate their input

## 2023-09-06 NOTE — Progress Notes (Signed)
   Palliative Medicine Inpatient Follow Up Note HPI: 88 y.o. female with past medical history of hypertension, diabetes, hyperlipidemia, and asthma admitted on 09/01/2023 with a change in mental status and seizure. Palliative care involved to assist in additional goals of care conversations.   Today's Discussion 09/06/2023  *Please note that this is a verbal dictation therefore any spelling or grammatical errors are due to the "Dragon Medical One" system interpretation.  Chart reviewed inclusive of vital signs, progress notes, laboratory results, and diagnostic images.   I met at bedside with Dawn [name] this morning. She is awake and aware of self. She denies pain, shortness of breath, or nausea.   I called patients son, Dawn [name] this morning, created space and opportunity for patient to explore thoughts feelings and fears regarding current medical situation. He shares that he is clear on the plan. He and his family are moving things in the home in preparation. He asks what the plan is for her discharge date. I shared that I would defer this question to the primary team.  Plan for [facility] to transition home with hospice. Patients son prefers Wednesday.  Questions and concerns addressed/Palliative Support Provided.   Objective Assessment: Vital Signs Vitals:   09/06/23 0320 09/06/23 0808  BP: (!) 164/73 (!) 154/69  Pulse: (!) 51 (!) 57  Resp: 16 17  Temp: 98.2 F (36.8 C) 98.3 F (36.8 C)  SpO2: 95% 98%    Intake/Output Summary (Last 24 hours) at 09/06/2023 1134 Last data filed at 09/06/2023 0932 Gross per 24 hour  Intake 1105.36 ml  Output 1 ml  Net 1104.36 ml   Last Weight  Most recent update: 09/03/2023  4:02 PM    Weight  51.7 kg (113 lb 15.7 oz)            Gen:  Elderly F chronically ill in appearance HEENT: moist mucous membranes CV: Regular rate and rhythm  PULM:  On RA, breathing is even and nonlabored ABD: soft/nontender  EXT: No edema  Neuro: Alert and oriented to  self  SUMMARY OF RECOMMENDATIONS   DNAR/DNI  Continue current care  Plan for discharge to home once medically optimized home with hospice through [facility]  Ongoing PMT support  Billing based on MDM: Moderate ______________________________________________________________________________________ Lamarr Lulas [place] Palliative Medicine Team Team Cell Phone: [phone] Please utilize secure chat with additional questions, if there is no response within 30 minutes please call the above phone number  Palliative Medicine Team providers are available by phone from 7am to 7pm daily and can be reached through the team cell phone.  Should this patient require assistance outside of these hours, please call the patient's attending physician.

## 2023-09-06 NOTE — Care Management Important Message (Signed)
Important Message  Patient Details  Name: Dawn [name] MRN: [id number] Date of Birth: July 09, 1935   Important Message Given:  Yes - Medicare IM     Dorena Bodo 09/06/2023, 3:16 PM

## 2023-09-06 NOTE — Plan of Care (Signed)

## 2023-09-06 NOTE — Plan of Care (Signed)
  Problem: Pain Managment: Goal: General experience of comfort will improve Outcome: Progressing   Problem: Safety: Goal: Ability to remain free from injury will improve Outcome: Progressing   

## 2023-09-06 NOTE — Assessment & Plan Note (Addendum)
Plan for home with home hospice.  Pt awake and talking today. -Plan for home with home hospice.  Need to get DME orders set up for her.

## 2023-09-06 NOTE — TOC Progression Note (Addendum)
Transition of Care [facility]) - Progression Note    Patient Details  Name: Dawn [name] MRN: [id number] Date of Birth: 1935-05-23  [facility]) CM/SW Contact  Ronny Bacon, RN Phone Number: 09/06/2023, 12:06 PM  Clinical Narrative:  Spoke with son regarding timing for patient to go home. Son inquired about bed linen, rolling walker and incontinence supply for home. Son was not sure if hospice supplies these items or not. Message to hospice liaison to inquire on behalf of son. Also, discussed [employer] option for incontinence supplies and bed linen needs.   Hospice to reach out to son to answer questions he may have. Hospice will supply rolling walker and incontinence supplies but not the bed linen. Son made aware.    Expected Discharge Plan: Home w Hospice Care Barriers to Discharge: Equipment Delay  Expected Discharge Plan and Services   Discharge Planning Services: CM Consult Post Acute Care Choice: Hospice Living arrangements for the past 2 months: Single Family Home                             [facility] Agency: [facility] [place] Date [facility] Agency Contacted: 09/05/23 Time HH Agency Contacted: 1154 Representative spoke with at [facility] Agency: Thea Gist   Social Determinants of Health (SDOH) Interventions SDOH Screenings   Food Insecurity: No Food Insecurity (09/02/2023)  Housing: Low Risk  (09/02/2023)  Transportation Needs: No Transportation Needs (09/02/2023)  Utilities: Not At Risk (09/02/2023)  Tobacco Use: Medium Risk (09/01/2023)    Readmission Risk Interventions     No data to display

## 2023-09-06 NOTE — Progress Notes (Signed)
     Daily Progress Note Intern Pager: [phone]  Patient name: Dawn [name] Medical record number: [id number] Date of birth: 21-Aug-1935 Age: 88 y.o. Gender: female  Primary Care Provider: Billee Cashing, MD Consultants: Neurology (signed off), Palliative Medicine Code Status: DNR/DNI   Pt Overview and Major Events to Date:  10/8: Admitted 10/9: CODE STATUS discussed with family, patient is DNR/DNI.  Palliative consulted. 10/12: Showing improvement in mentation.  Assessment and Plan: Dawn [name] is an 88 year old female admitted with altered mentation, with concern for suspected PRES versus seizure-like activity.  Having slow improvement. Family interested in home wit hospice care. As such IV fluids have been discontinued already. Awaiting DME delivered to family home, then pt ready for discharge.   Pertinent PMH/PSH includes dementia, hypertension, T2DM, hyperlipidemia.  Assessment & Plan AMS (altered mental status) Plan for home with home hospice.  Pt awake and talking today. -Plan for home with home hospice.  Need to get DME orders set up for her. Poor nutrition May eat dysphagia 1 diet as desired. IVF have been discontinued. -Monitor p.o. intake -Ensure is ordered 3 times daily between meals -RD following appreciate their input   Chronic and Stable Problems:  T2DM: Continuing to hold home medications (Glipizide 10 mg daily, metformin 500 mg daily) in light of low p.o. intake, monitoring CBG with morning labs.  However, would not resume glipizide given risk of hypoglycemia Focal seizure: Continue PO Keppra 500 mg every 12 hours HTN: Valsartan 150 mg daily Thyroid mass: Family has opted not to pursue further workup or management mass at this time  FEN/GI: Dysphagia 1 diet, assistance with meals PPx: SCDs Dispo: Home with hospice pending medical bed/DME delivered to house. Anticipate discharge tomorrow 10/15.  Subjective:  Pt seen this AM sitting up in bed  with breakfast at bedside. She states she is looking forward to breakfast. Denies pain or discomfort. No concerns today, looking forward to going home.  Objective: Temp:  [97.9 F (36.6 C)-99.1 F (37.3 C)] 98.2 F (36.8 C) (10/14 0320) Pulse Rate:  [45-84] 51 (10/14 0320) Resp:  [16-19] 16 (10/14 0320) BP: (136-186)/(66-87) 164/73 (10/14 0320) SpO2:  [95 %-100 %] 95 % (10/14 0320) Physical Exam: General: frail-appearing, pleasant, no acute distress. Cardio: Regular rate, regular rhythm, no murmurs on exam. Radial pulses intact 2+. Pulm: Clear, no wheezing, no crackles. No increased work of breathing. On room air. Abdominal: bowel sounds present, soft, non-tender, non-distended. Extremities: Skin warm and dry. Psych:  Alert, communicative, and cooperative.   Laboratory: Most recent CBC Lab Results  Component Value Date   WBC 3.6 (L) 09/06/2023   HGB 12.5 09/06/2023   HCT 38.3 09/06/2023   MCV 92.7 09/06/2023   PLT 225 09/06/2023   Most recent BMP    Latest Ref Rng & Units 09/06/2023    4:59 AM  BMP  Glucose 70 - 99 mg/dL 88   BUN 8 - 23 mg/dL 16   Creatinine 1.47 - 1.00 mg/dL 8.29   Sodium 562 - 130 mmol/L 139   Potassium 3.5 - 5.1 mmol/L 4.4   Chloride 98 - 111 mmol/L 103   CO2 22 - 32 mmol/L 27   Calcium 8.9 - 10.3 mg/dL 9.7     Cyndia Skeeters, DO 09/06/2023, 7:42 AM  PGY-1, [place] Family Medicine FPTS Intern pager: [phone], text pages welcome Secure chat group [facility] Teaching Service

## 2023-09-07 DIAGNOSIS — Z515 Encounter for palliative care: Secondary | ICD-10-CM | POA: Diagnosis not present

## 2023-09-07 DIAGNOSIS — R569 Unspecified convulsions: Secondary | ICD-10-CM | POA: Diagnosis not present

## 2023-09-07 DIAGNOSIS — R4182 Altered mental status, unspecified: Secondary | ICD-10-CM | POA: Diagnosis not present

## 2023-09-07 LAB — CULTURE, BLOOD (ROUTINE X 2)
Culture: NO GROWTH
Special Requests: ADEQUATE

## 2023-09-07 NOTE — Progress Notes (Signed)
11:50H  Patient discharged via wheelchair accompanied by hospital volunteer.  Son is in [facility]. He will be waiting for the patient in the main entrance. AVS reviewed and discussed by Virtual Nurse Lequita Halt to the son.

## 2023-09-07 NOTE — Progress Notes (Signed)
SLP Cancellation Note  Patient Details Name: Dawn [name] MRN: [id number] DOB: 1935-10-04   Cancelled treatment:       Reason Eval/Treat Not Completed: Other (comment). Plan is home with hospice tomorrow (Wednesday). SLP recommends continue Dys 1 (puree), thin liquids diet.   Angela Nevin, MA, CCC-SLP Speech Therapy

## 2023-09-07 NOTE — Assessment & Plan Note (Deleted)
Plan for home with home hospice.  Pt awake and talking today. -Plan for home with home hospice.  Need to get DME orders set up for her.

## 2023-09-07 NOTE — Assessment & Plan Note (Deleted)
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  Very clear right tracheal and esophageal deviation seen.  High concern for possible thyroid malignancy.  Fortunately, no airway compromise. -Through discussion, family has opted to not pursue further workup or management of mass.

## 2023-09-07 NOTE — Assessment & Plan Note (Deleted)
Stable.  No further seizure-like activity -Transitioned IV levetiracetam to p.o. levetiracetam 500 mg every 12 hours

## 2023-09-07 NOTE — Discharge Instructions (Addendum)
Dear Dawn [name],  Thank you for letting us participate in your care. You were hospitalized for confusion and diagnosed with AMS (altered mental status) likely due to seizures. You were treated with seizure medications. You were discharged with home hospice.  POST-HOSPITAL & CARE INSTRUCTIONS Take all of your medications as listed in this packet. Go to your follow up appointments (listed below)  DOCTOR'S APPOINTMENT   No future appointments.  Follow-up Information     Billee Cashing, MD. Schedule an appointment as soon as possible for a visit.   Specialty: Family Medicine Why: As needed Contact information: [address] [place] [address] [phone]                 Take care and be well!  Family Medicine Teaching Service Inpatient Team [place]  [facility]  [address] [place], [place] [address] [phone]

## 2023-09-07 NOTE — Progress Notes (Signed)
   Palliative Medicine Inpatient Follow Up Note HPI: 88 y.o. female with past medical history of hypertension, diabetes, hyperlipidemia, and asthma admitted on 09/01/2023 with a change in mental status and seizure. Palliative care involved to assist in additional goals of care conversations.   Today's Discussion 09/07/2023  *Please note that this is a verbal dictation therefore any spelling or grammatical errors are due to the "Dragon Medical One" system interpretation.  Chart reviewed inclusive of vital signs, progress notes, laboratory results, and diagnostic images.   I met at bedside with Dawn [name] this morning, she is sitting up in the chair waiting for breakfast. She is in good spirits though only aware of self. She denies pain, shortness of breath or nausea.   I spoke to patients son, Dawn [name]. Confirmed the plan to get her home with hospice. Provided education on what home will look like. Dawn [name] is in a good frame of mind in that he feels he is doing right by his mother. He is hopeful to get her home sooner than later. I shared that I would reflect this to the [facility] team.   Goals are for patient to go home with hospice though [facility] today.   Questions and concerns addressed/Palliative Support Provided.   Objective Assessment: Vital Signs Vitals:   09/07/23 0422 09/07/23 0838  BP: (!) 174/65 (!) 153/66  Pulse: (!) 51 69  Resp: 16 14  Temp: (!) 97.5 F (36.4 C) 99 F (37.2 C)  SpO2: 100% 100%    Intake/Output Summary (Last 24 hours) at 09/07/2023 1610 Last data filed at 09/07/2023 0900 Gross per 24 hour  Intake 413 ml  Output 2 ml  Net 411 ml   Last Weight  Most recent update: 09/03/2023  4:02 PM    Weight  51.7 kg (113 lb 15.7 oz)            Gen:  Elderly F chronically ill in appearance HEENT: moist mucous membranes CV: Regular rate and rhythm  PULM:  On RA, breathing is even and nonlabored ABD: soft/nontender  EXT: No edema  Neuro: Alert and oriented to  self  SUMMARY OF RECOMMENDATIONS   DNAR/DNI  Continue current care  Plan for discharge to home once medically optimized home with hospice through [facility] Today  Time: 35 ______________________________________________________________________________________ Lamarr Lulas [place] Palliative Medicine Team Team Cell Phone: [phone] Please utilize secure chat with additional questions, if there is no response within 30 minutes please call the above phone number  Palliative Medicine Team providers are available by phone from 7am to 7pm daily and can be reached through the team cell phone.  Should this patient require assistance outside of these hours, please call the patient's attending physician.

## 2023-09-07 NOTE — Assessment & Plan Note (Deleted)
May eat dysphagia 1 diet as desired. IVF have been discontinued. -Monitor p.o. intake -Ensure is ordered 3 times daily between meals -RD following appreciate their input

## 2023-09-07 NOTE — Discharge Summary (Addendum)
Family Medicine Teaching [facility] Discharge Summary  Patient name: Dawn [name] Medical record number: [id number] Date of birth: 1935/02/21 Age: 88 y.o. Gender: female Date of Admission: 09/01/2023  Date of Discharge: 09/07/23 Admitting Physician: Shelby Mattocks, DO  Primary Care Provider: Billee Cashing, MD Consultants: Neurology, Palliative Medicine  Indication for Hospitalization: AMS  Discharge Diagnoses/Problem List:  Principal Problem for Admission: AMS 2/2 focal seizure versus PRES Other Problems addressed during stay:  Principal Problem:   AMS (altered mental status) Active Problems:   Focal seizure (HCC)   Poor nutrition   Thyroid mass   Hypertension   Malnutrition of moderate degree    Brief Hospital Course:  Dawn [name] is a 88 y.o. female who was admitted to the [facility] Medicine Teaching Service at [facility] for AMS in the setting of PRES. Hospital course is outlined below by problem.   AMS from seizures likely d/t PRES and cerebral amyloid angiopathy Presented after sudden change from baseline with new onset difficulty with speech and ambulation. HDS though unable to participate in any aspect of exam on arrival.  Chest x-ray, CT head without acute abnormality.  CTA head and neck with thyroid mass as below.  Labs overall unremarkable.  Neurology consulted who started EEG, Keppra, Vimpat given concern for seizure.  MRI collected which demonstrated findings consistent with PRES and cerebral amyloid angiopathy. Given extent of patient's presentation, goals of care discussions initiated and patient made DNR/DNI. Ultimately family decided to take patient home with home Hospice.  Thyroid mass Incidentally discovered on CT a head and neck as above.  8.7 cm in size arising from left thyroid.  TSH within normal limits.  Thyroid ultrasound with nodule meeting criteria for FNA outpatient as well as diffuse goitrous enlargement of the entire left  hemithyroid.  Severe protein calorie malnutrition BMI 16 on presentation with evidence of cachexia.  Dietitian consulted with recommendations to eat as desired and to offer Ensure Enlive supplements. She initially received IV fluids during her admission due to low urine output, but when decision was made for Hospice the patient's IV fluids were discontinued; she did begin to PO better by the time of discharge and was able to feed herself (representing a return to baseline).  Other conditions that were chronic and stable: T2DM: Held home medications (Glipizide 10 mg daily, metformin 500 mg daily) in light of low p.o. intake, and monitored CBG with morning labs.  However, did not resume glipizide given risk of hypoglycemia. HTN: Valsartan 150 mg daily   Issues for follow up: Consider thyroid FNA outpatient as desired BP mildly elevated over admission, can titrate as desired    Disposition: Home with hospice  Discharge Condition: Stable  Discharge Exam:  Vitals:   09/07/23 0422 09/07/23 0838  BP: (!) 174/65 (!) 153/66  Pulse: (!) 51 69  Resp: 16 14  Temp: (!) 97.5 F (36.4 C) 99 F (37.2 C)  SpO2: 100% 100%   Physical Exam: General: Elderly, frail-appearing, very pleasant woman. Sitting up in chair at bedside eating breakfast. NAD. Cardiovascular: RRR, no murmurs. Respiratory: CTA bilaterally. No increased work off breathing. On room air. Abdomen: Normoactive bowel sounds, soft, nontender. Extremities: Moves all extremities equally. Skin warm and dry.  Significant Procedures: none  Significant Labs and Imaging:  Recent Labs  Lab 09/06/23 0459  WBC 3.6*  HGB 12.5  HCT 38.3  PLT 225   Recent Labs  Lab 09/06/23 0459  NA 139  K 4.4  CL 103  CO2  27  GLUCOSE 88  BUN 16  CREATININE 0.67  CALCIUM 9.7   10/9 CT Head:  1. 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 Recommend MRI for further evaluation. Recommend with contrast exam given impression #2. 2.  Somewhat conspicuous hypoattenuation within the white matter of the right greater than left frontoparietal lobes with possible extension to involve overlying right parietal cortex. While this could be in part due to chronic microvascular ischemic change, recommend MRI with contrast to exclude other etiologies including ischemia or a mass. 10/9 CXR: Low lung volumes with atelectasis at the lung bases.  10/9 CTA Head and Neck: 1. No emergent large vessel occlusion. 2. Moderate left vertebral artery origin stenosis. 3. 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 While indeterminate, findings raise concern for thyroid cancer. Recommend ultrasound for further characterization and consideration of tissue sampling (ref: J Am Coll Radiol. 2015 Feb;12(2): 143-50). Additionally, the inferior extent of this mass is incompletely characterized and CT of the chest with contrast may be useful to fully evaluate. 10/9 Thyroid US:  1. Diffuse goitrous enlargement of the entire left hemi thyroid. 2. Probable 2.4 cm TI-RADS category 4 nodule versus pseudo nodule in the left mid gland meets criteria to consider fine-needle aspiration biopsy. 10/10 MRI Brain:  1. Motion degraded exam. 2. Extensive patchy and confluent T2/FLAIR hyperintensity involving both cerebral hemispheres, most pronounced posteriorly at the parietal lobes. Given the history of seizure, appearance is favored to reflect sequelae of pressed. 3. Single punctate focus of diffusion signal abnormality within the left cerebellum as above. While this is favored to be artifactual, a possible tiny ischemic infarct is difficult to exclude. 4. Innumerable punctate chronic micro hemorrhages involving both cerebral hemispheres, predominantly peripheral in location, most characteristic  of cerebral amyloid angiopathy.  Results/Tests Pending at Time of Discharge: none  Discharge Medications:  Allergies as of 09/07/2023       Reactions   Penicillins Anaphylaxis   Has patient had a PCN reaction causing immediate rash, facial/tongue/throat swelling, SOB or lightheadedness with hypotension: Yes Has patient had a PCN reaction causing severe rash involving mucus membranes or skin necrosis: Yes Has patient had a PCN reaction that required hospitalization No Has patient had a PCN reaction occurring within the last 10 years: No If all of the above answers are "NO", then may proceed with Cephalosporin use.   Shellfish Allergy Swelling        Medication List     STOP taking these medications    albuterol 108 (90 Base) MCG/ACT inhaler Commonly known as: VENTOLIN HFA   amLODipine 2.5 MG tablet Commonly known as: NORVASC   fosinopril 10 MG tablet Commonly known as: MONOPRIL   glipiZIDE 10 MG tablet Commonly known as: GLUCOTROL   metFORMIN 500 MG 24 hr tablet Commonly known as: GLUCOPHAGE-XR   triamterene-hydrochlorothiazide 37.5-25 MG capsule Commonly known as: DYAZIDE   valsartan-hydrochlorothiazide 160-12.5 MG tablet Commonly known as: DIOVAN-HCT       TAKE these medications    feeding supplement Liqd Take 237 mLs by mouth 3 (three) times daily between meals.   irbesartan 150 MG tablet Commonly known as: AVAPRO Take 1 tablet (150 mg total) by mouth daily.   levETIRAcetam 500 MG tablet Commonly known as: KEPPRA Take 1 tablet (500 mg total) by mouth 2 (two) times daily.  Discharge Instructions: Please refer to Patient Instructions section of EMR for full details.  Patient was counseled important signs and symptoms that should prompt return to medical care, changes in medications, dietary instructions, activity restrictions, and follow up appointments.   Follow-Up Appointments:  Follow-up Information     Billee Cashing, MD. Schedule an  appointment as soon as possible for a visit.   Specialty: Family Medicine Why: As needed Contact information: [address] [place] [address] [phone]                 Cyndia Skeeters, DO 09/07/2023, 11:17 AM PGY-1, [facility]   Upper Level Addendum:  I have seen and evaluated this patient along with Dr. Mliss Sax and reviewed the above note.  Darral Dash, DO 09/08/2023, 8:19 AM PGY-3, [facility]

## 2023-09-07 NOTE — Assessment & Plan Note (Deleted)
Elevated blood pressures continue, although wide pulse pressure -Transitioned to some of her home regimen.  Started valsartan (formulary that we have here is the irbesartan) 150 mg daily. -Monitor blood pressures

## 2023-09-07 NOTE — TOC Transition Note (Signed)
Transition of Care [facility]) - CM/SW Discharge Note   Patient Details  Name: Dawn [name] MRN: [id number] Date of Birth: [birth year]  [facility]) CM/SW Contact:  Kermit Balo, RN Phone Number: 09/07/2023, 11:14 AM   Clinical Narrative:     Patient is discharging home with hospice services through [facility]. Per son the ordered DME has been delivered to the home. [facility] aware of d/c home today.  Son is providing transportation home.   Final next level of care: Home w Hospice Care Barriers to Discharge: No Barriers Identified   Patient Goals and CMS Choice CMS Medicare.gov Compare Post Acute Care list provided to:: Other (Comment Required) Choice offered to / list presented to : Adult Children (son Tammy Sours)  Discharge Placement                         Discharge Plan and Services Additional resources added to the After Visit Summary for     Discharge Planning Services: CM Consult Post Acute Care Choice: Hospice                      [facility] Agency: [facility] Date [facility] Agency Contacted: 09/05/23 Time HH Agency Contacted: 1154 Representative spoke with at [facility] Agency: Thea Gist  Social Determinants of Health (SDOH) Interventions SDOH Screenings   Food Insecurity: No Food Insecurity (09/02/2023)  Housing: Low Risk  (09/02/2023)  Transportation Needs: No Transportation Needs (09/02/2023)  Utilities: Not At Risk (09/02/2023)  Tobacco Use: Medium Risk (09/01/2023)     Readmission Risk Interventions     No data to display

## 2024-09-04 ENCOUNTER — Emergency Department (HOSPITAL_COMMUNITY)

## 2024-09-04 ENCOUNTER — Inpatient Hospital Stay (HOSPITAL_COMMUNITY)
Admission: EM | Admit: 2024-09-04 | Discharge: 2024-09-08 | DRG: 100 | Disposition: A | Discharge status: Hospice | Attending: Internal Medicine | Admitting: Internal Medicine

## 2024-09-04 ENCOUNTER — Inpatient Hospital Stay (HOSPITAL_COMMUNITY)

## 2024-09-04 ENCOUNTER — Other Ambulatory Visit: Payer: Self-pay

## 2024-09-04 ENCOUNTER — Encounter (HOSPITAL_COMMUNITY): Payer: Self-pay

## 2024-09-04 DIAGNOSIS — R64 Cachexia: Secondary | ICD-10-CM | POA: Diagnosis present

## 2024-09-04 DIAGNOSIS — Z79899 Other long term (current) drug therapy: Secondary | ICD-10-CM

## 2024-09-04 DIAGNOSIS — Z7189 Other specified counseling: Secondary | ICD-10-CM | POA: Diagnosis not present

## 2024-09-04 DIAGNOSIS — Z993 Dependence on wheelchair: Secondary | ICD-10-CM

## 2024-09-04 DIAGNOSIS — Z66 Do not resuscitate: Secondary | ICD-10-CM | POA: Diagnosis present

## 2024-09-04 DIAGNOSIS — I10 Essential (primary) hypertension: Secondary | ICD-10-CM | POA: Diagnosis present

## 2024-09-04 DIAGNOSIS — I2699 Other pulmonary embolism without acute cor pulmonale: Secondary | ICD-10-CM | POA: Diagnosis present

## 2024-09-04 DIAGNOSIS — I680 Cerebral amyloid angiopathy: Secondary | ICD-10-CM | POA: Diagnosis present

## 2024-09-04 DIAGNOSIS — E854 Organ-limited amyloidosis: Secondary | ICD-10-CM | POA: Diagnosis present

## 2024-09-04 DIAGNOSIS — R569 Unspecified convulsions: Principal | ICD-10-CM

## 2024-09-04 DIAGNOSIS — J45909 Unspecified asthma, uncomplicated: Secondary | ICD-10-CM | POA: Diagnosis present

## 2024-09-04 DIAGNOSIS — Z8249 Family history of ischemic heart disease and other diseases of the circulatory system: Secondary | ICD-10-CM

## 2024-09-04 DIAGNOSIS — Z6821 Body mass index (BMI) 21.0-21.9, adult: Secondary | ICD-10-CM | POA: Diagnosis not present

## 2024-09-04 DIAGNOSIS — Z91013 Allergy to seafood: Secondary | ICD-10-CM

## 2024-09-04 DIAGNOSIS — R4182 Altered mental status, unspecified: Secondary | ICD-10-CM

## 2024-09-04 DIAGNOSIS — F0390 Unspecified dementia without behavioral disturbance: Secondary | ICD-10-CM | POA: Diagnosis not present

## 2024-09-04 DIAGNOSIS — L89152 Pressure ulcer of sacral region, stage 2: Secondary | ICD-10-CM | POA: Diagnosis present

## 2024-09-04 DIAGNOSIS — E119 Type 2 diabetes mellitus without complications: Secondary | ICD-10-CM | POA: Diagnosis present

## 2024-09-04 DIAGNOSIS — Z880 Allergy status to penicillin: Secondary | ICD-10-CM

## 2024-09-04 DIAGNOSIS — E0789 Other specified disorders of thyroid: Secondary | ICD-10-CM | POA: Diagnosis present

## 2024-09-04 DIAGNOSIS — I2602 Saddle embolus of pulmonary artery with acute cor pulmonale: Secondary | ICD-10-CM | POA: Diagnosis not present

## 2024-09-04 DIAGNOSIS — Z515 Encounter for palliative care: Secondary | ICD-10-CM

## 2024-09-04 DIAGNOSIS — Z87891 Personal history of nicotine dependence: Secondary | ICD-10-CM | POA: Diagnosis not present

## 2024-09-04 DIAGNOSIS — G40909 Epilepsy, unspecified, not intractable, without status epilepticus: Principal | ICD-10-CM | POA: Diagnosis present

## 2024-09-04 LAB — ETHANOL: Alcohol, Ethyl (B): <15 mg/dL (ref <15)

## 2024-09-04 LAB — I-STAT CHEM 8, ED
BUN: 7 mg/dL — ABNORMAL LOW (ref 8–23)
Calcium, Ion: 1.2 mmol/L (ref 1.15–1.40)
Chloride: 102 mmol/L (ref 98–111)
Creatinine, Ser: 1 mg/dL (ref 0.44–1.00)
Glucose, Bld: 121 mg/dL — ABNORMAL HIGH (ref 70–99)
HCT: 38 % (ref 36.0–46.0)
Hemoglobin: 12.9 g/dL (ref 12.0–15.0)
Potassium: 4.4 mmol/L (ref 3.5–5.1)
Sodium: 142 mmol/L (ref 135–145)
TCO2: 28 mmol/L (ref 22–32)

## 2024-09-04 LAB — RAPID URINE DRUG SCREEN, HOSP PERFORMED
Amphetamines: NOT DETECTED
Barbiturates: NOT DETECTED
Benzodiazepines: POSITIVE — AB
Cocaine: NOT DETECTED
Opiates: NOT DETECTED
Tetrahydrocannabinol: NOT DETECTED

## 2024-09-04 LAB — CBC
HCT: 45.6 % (ref 36.0–46.0)
Hemoglobin: 14.9 g/dL (ref 12.0–15.0)
MCH: 30 pg (ref 26.0–34.0)
MCHC: 32.7 g/dL (ref 30.0–36.0)
MCV: 91.9 fL (ref 80.0–100.0)
Platelets: 157 K/uL (ref 150–400)
RBC: 4.96 MIL/uL (ref 3.87–5.11)
RDW: 13.7 % (ref 11.5–15.5)
WBC: 5.3 K/uL (ref 4.0–10.5)
nRBC: 0 % (ref 0.0–0.2)

## 2024-09-04 LAB — URINALYSIS, ROUTINE W REFLEX MICROSCOPIC
Bilirubin Urine: NEGATIVE
Glucose, UA: NEGATIVE mg/dL
Ketones, ur: NEGATIVE mg/dL
Leukocytes,Ua: NEGATIVE
Nitrite: NEGATIVE
Protein, ur: 100 mg/dL — AB
Specific Gravity, Urine: 1.025 (ref 1.005–1.030)
pH: 8 (ref 5.0–8.0)

## 2024-09-04 LAB — CBC WITH DIFFERENTIAL/PLATELET
Abs Immature Granulocytes: 0.01 K/uL (ref 0.00–0.07)
Basophils Absolute: 0 K/uL (ref 0.0–0.1)
Basophils Relative: 1 %
Eosinophils Absolute: 0 K/uL (ref 0.0–0.5)
Eosinophils Relative: 0 %
HCT: 40.8 % (ref 36.0–46.0)
Hemoglobin: 13.4 g/dL (ref 12.0–15.0)
Immature Granulocytes: 0 %
Lymphocytes Relative: 8 %
Lymphs Abs: 0.4 K/uL — ABNORMAL LOW (ref 0.7–4.0)
MCH: 30 pg (ref 26.0–34.0)
MCHC: 32.8 g/dL (ref 30.0–36.0)
MCV: 91.3 fL (ref 80.0–100.0)
Monocytes Absolute: 0.2 K/uL (ref 0.1–1.0)
Monocytes Relative: 4 %
Neutro Abs: 3.8 K/uL (ref 1.7–7.7)
Neutrophils Relative %: 87 %
Platelets: 246 K/uL (ref 150–400)
RBC: 4.47 MIL/uL (ref 3.87–5.11)
RDW: 12.3 % (ref 11.5–15.5)
WBC: 4.4 K/uL (ref 4.0–10.5)
nRBC: 0 % (ref 0.0–0.2)

## 2024-09-04 LAB — AMMONIA: Ammonia: 45 umol/L — ABNORMAL HIGH (ref 9–35)

## 2024-09-04 LAB — APTT: aPTT: 23 s — ABNORMAL LOW (ref 24–36)

## 2024-09-04 LAB — PROTIME-INR
INR: 1.6 — ABNORMAL HIGH (ref 0.8–1.2)
Prothrombin Time: 19.4 s — ABNORMAL HIGH (ref 11.4–15.2)

## 2024-09-04 LAB — LACTIC ACID, PLASMA: Lactic Acid, Venous: 1.9 mmol/L (ref 0.5–1.9)

## 2024-09-04 LAB — D-DIMER, QUANTITATIVE: D-Dimer, Quant: >20 ug{FEU}/mL — ABNORMAL HIGH (ref 0.00–0.50)

## 2024-09-04 LAB — CBG MONITORING, ED: Glucose-Capillary: 112 mg/dL — ABNORMAL HIGH (ref 70–99)

## 2024-09-04 MED ORDER — IOHEXOL 350 MG/ML SOLN
60.0000 mL | Freq: Once | INTRAVENOUS | Status: AC | PRN
  Administered 2024-09-04: 60 mL via INTRAVENOUS

## 2024-09-04 MED ORDER — ENOXAPARIN SODIUM 40 MG/0.4ML IJ SOSY: 40.0000 mg | PREFILLED_SYRINGE | INTRAMUSCULAR | Status: DC

## 2024-09-04 MED ORDER — NALOXONE HCL 2 MG/2ML IJ SOSY
2.0000 mg | PREFILLED_SYRINGE | Freq: Once | INTRAMUSCULAR | Status: AC
  Administered 2024-09-04: 2 mg via INTRAVENOUS
  Filled 2024-09-04: qty 2

## 2024-09-04 MED ORDER — LEVETIRACETAM (KEPPRA) 500 MG/5 ML ADULT IV PUSH
2000.0000 mg | Freq: Once | INTRAVENOUS | Status: AC
  Administered 2024-09-04: 2000 mg via INTRAVENOUS

## 2024-09-04 MED ORDER — LORAZEPAM 2 MG/ML IJ SOLN: 2.0000 mg | Freq: Once | INTRAMUSCULAR | Status: AC

## 2024-09-04 MED ORDER — LABETALOL HCL 5 MG/ML IV SOLN
5.0000 mg | Freq: Once | INTRAVENOUS | Status: AC
Start: 2024-09-04 — End: 2024-09-04
  Administered 2024-09-04: 5 mg via INTRAVENOUS
  Filled 2024-09-04: qty 4

## 2024-09-04 MED ORDER — LORAZEPAM 2 MG/ML IJ SOLN
INTRAMUSCULAR | Status: AC
  Administered 2024-09-04: 2 mg via INTRAVENOUS
  Filled 2024-09-04: qty 1

## 2024-09-04 MED ORDER — LEVETIRACETAM (KEPPRA) 500 MG/5 ML ADULT IV PUSH
750.0000 mg | Freq: Two times a day (BID) | INTRAVENOUS | Status: DC
  Administered 2024-09-05 – 2024-09-08 (×7): 750 mg via INTRAVENOUS
  Filled 2024-09-04 (×7): qty 10

## 2024-09-04 MED ORDER — IOHEXOL 350 MG/ML SOLN
75.0000 mL | Freq: Once | INTRAVENOUS | Status: AC | PRN
  Administered 2024-09-04: 75 mL via INTRAVENOUS

## 2024-09-04 NOTE — ED Notes (Signed)
 Patient transported to MRI

## 2024-09-04 NOTE — ED Notes (Signed)
 3W called to notify of pt transport to their floor

## 2024-09-04 NOTE — ED Triage Notes (Addendum)
 Patient BIB EMS from home c/o seizures. Patient does have a history of seizures. Patient's husband states that she was having seizures on and off for about an hour. Patient is on Keppra  and took that this morning. Patient also has new onset dementia. Patient has a right bundle block seen on EKG. Patient was given 5mg  of Midazolam for seizures with EMS. Patient's family states that it is normal for her to become unresponsive after seizures.

## 2024-09-04 NOTE — Consult Note (Addendum)
 NEUROLOGY CONSULT NOTE   Date of service: September 04, 2024 Patient Name: Dawn [name] MRN:  [id number] DOB:  1935-06-12 Chief Complaint: seizures/Code stroke  Requesting Provider: Francesca Elsie CROME, MD  History of Present Illness  Dawn [name] is a 89 y.o. female with hx of Seizures on Keppra  500mg  BID, dementia, wheelchair bound, DM, HTN, who presents from home for on and off seizures for about 1 hour at home. Son is at the bedside. He provides the history. He states she has not had a seizure since 08/2023. He also states she has not missed any doses of Keppra  as he administers her medications and states she took one this morning. He does endorse that she has not slept in at least 2 days. Patient was given 5mg  IV Midazolam with EMS. Code stroke was activated in the ED as patient unable to give history. CTA head and neck NO LVO. After CT scan she began to have rhythmic twitching of the right arm and right leg with head turned right and right gaze. She was given 2 mg IV ativan and loaded with 2000 mg IV Keppra .   Seizure history: Semiology: Right gaze deviation, unresponsiveness Onset: 08/2023, presented to [facility] ED for R gaze preference. Code stroke activated, imaging negative. Started on Keppra  500mg  BID.    LKW: 2000 10/12  Modified rankin score: 4-Needs assistance to walk and tend to bodily needs IV Thrombolysis:  No not a stroke  EVT:  No LVO   NIHSS components Score: Comment  1a Level of Conscious 0[]  1[]  2[x]  3[]      1b LOC Questions 0[]  1[]  2[x]       1c LOC Commands 0[]  1[]  2[x]       2 Best Gaze 0[]  1[]  2[x]       3 Visual 0[]  1[]  2[x]  3[]      4 Facial Palsy 0[x]  1[]  2[]  3[]      5a Motor Arm - left 0[]  1[]  2[]  3[x]  4[]  UN[]    5b Motor Arm - Right 0[]  1[]  2[]  3[x]  4[]  UN[]    6a Motor Leg - Left 0[]  1[]  2[]  3[x]  4[]  UN[]    6b Motor Leg - Right 0[]  1[]  2[]  3[x]  4[]  UN[]    7 Limb Ataxia 0[x]  1[]  2[]  UN[]      8 Sensory 0[x]  1[]  2[]  UN[]      9 Best Language 0[]  1[]  2[]   3[x]      10 Dysarthria 0[]  1[]  2[x]  UN[]      11 Extinct. and Inattention 0[x]  1[]  2[]       TOTAL: 25      ROS  Comprehensive ROS Unable to ascertain due to AMS  Past History   Past Medical History:  Diagnosis Date   Asthma    Diabetes mellitus without complication (HCC)    Hypertension     Past Surgical History:  Procedure Laterality Date   BOWEL RESECTION  11/12/2012   Procedure: SMALL BOWEL RESECTION;  Surgeon: Morene ONEIDA Olives, MD;  Location: [facility] OR;  Service: General;  Laterality: N/A;   LAPAROTOMY  11/12/2012   Procedure: EXPLORATORY LAPAROTOMY;  Surgeon: Morene ONEIDA Olives, MD;  Location: [facility] OR;  Service: General;  Laterality: N/A;    Family History: Family History  Problem Relation Age of Onset   Cancer Mother    Heart failure Father     Social History  reports that she has quit smoking. She quit smokeless tobacco use about 31 years ago. She reports that she does not drink alcohol and does not use drugs.  Allergies  Allergen Reactions   Penicillins Anaphylaxis    Has patient had a PCN reaction causing immediate rash, facial/tongue/throat swelling, SOB or lightheadedness with hypotension: Yes Has patient had a PCN reaction causing severe rash involving mucus membranes or skin necrosis: Yes Has patient had a PCN reaction that required hospitalization No Has patient had a PCN reaction occurring within the last 10 years: No If all of the above answers are NO, then may proceed with Cephalosporin use.   Shellfish Allergy Swelling    Medications   Current Facility-Administered Medications:    LORazepam (ATIVAN) injection 2 mg, 2 mg, Intravenous, Once, Waddell Aquas A, NP  Current Outpatient Medications:    feeding supplement (ENSURE ENLIVE / ENSURE PLUS) LIQD, Take 237 mLs by mouth 3 (three) times daily between meals., Disp: , Rfl:    irbesartan  (AVAPRO ) 150 MG tablet, Take 1 tablet (150 mg total) by mouth daily., Disp: 30 tablet, Rfl: 0   levETIRAcetam   (KEPPRA ) 500 MG tablet, Take 1 tablet (500 mg total) by mouth 2 (two) times daily., Disp: 60 tablet, Rfl: 0  Vitals   Vitals:   09/27/2024 1140 09-27-2024 1145 27-Sep-2024 1147 09-27-24 1200  BP:  (!) 179/109  (!) 192/107  Pulse:  97  86  Resp:  (!) 21  (!) 22  Temp:   98.1 F (36.7 C)   TempSrc:   Axillary   SpO2: 91% 93%  99%    There is no height or weight on file to calculate BMI.   Physical Exam   Constitutional: elderly female  Psych: unable to assess  Eyes: No scleral injection.  HENT: No OP obstruction.  Head: Normocephalic.  Cardiovascular: Normal rate and regular rhythm.  Respiratory: Effort normal, non-labored breathing.  GI: Soft.  No distension. There is no tenderness.  Skin: WDI.   Neurologic Examination   Mental Status -  Recently received 5mg  IV midazolam  Eyes are closed, does not open eyes to voice or stimulation. Does not follow commands  Cranial Nerves II - XII - II - Visual field inconsistent blink to threat  III, IV, VI - right gaze preference  V - unable to assess  VII - Facial movement intact bilaterally . VIII - Hearing & vestibular intact bilaterally . XI - unable to assess  XII - unable to assess   Motor Strength - patient responds to painful stimuli in all 4 extremities with slight withdrawal .  Bulk was normal and fasciculations were absent .   Motor Tone - Muscle tone was assessed at the neck and appendages and was normal . Sensory - moans to noxious stimuli  Coordination - unable to assess  Gait and Station - deferred.  Labs/Imaging/Neurodiagnostic studies   CBC:  Recent Labs  Lab Sep 27, 2024 1206 2024-09-27 1223  WBC  --  4.4  NEUTROABS  --  3.8  HGB 12.9 13.4  HCT 38.0 40.8  MCV  --  91.3  PLT  --  246   Basic Metabolic Panel:  Lab Results  Component Value Date   NA 142 2024-09-27   K 4.4 2024/09/27   CO2 27 09/06/2023   GLUCOSE 121 (H) 2024/09/27   BUN 7 (L) 09/27/2024   CREATININE 1.00 09-27-24   CALCIUM 9.7 09/06/2023    GFRNONAA >60 09/06/2023   GFRAA 58 (L) 02/09/2016   Lipid Panel: No results found for: LDLCALC HgbA1c: No results found for: HGBA1C Urine Drug Screen: No results found for: LABOPIA, COCAINSCRNUR, LABBENZ, AMPHETMU, THCU, LABBARB  Alcohol  Level     Component Value Date/Time   ETH <10 09/02/2023 1256   INR  Lab Results  Component Value Date   INR 1.0 09/01/2023   APTT  Lab Results  Component Value Date   APTT 26 09/01/2023   AED levels: No results found for: PHENYTOIN, ZONISAMIDE, LAMOTRIGINE, LEVETIRACETA  CT angio Head and Neck with contrast(Personally reviewed): NO LVO.  Filling defects in the right pulmonary artery and right lobar branches concerning for pulmonary emboli.  Labs Ammonia 45  ASSESSMENT   Dawn [name] is a 89 y.o. female DM, HTN, Seizures on Keppra  500mg  BID, dementia, wheelchair bound  who presents from home for on and off seizures for about 1 hour at home.Son states she has not missed any doses   RECOMMENDATIONS  - Seizure precautions  - 2 mg IV ativan x 1  - 2000 mg IV Keppra  x 1 now followed by 750 mg BID  - evaluate for infection  - UDS  - Neurology will continue to follow  ______________________________________________________________________    Signed, Karna DELENA Geralds, NP [facility]  NEUROHOSPITALIST ADDENDUM Performed a face to face diagnostic evaluation.   I have reviewed the contents of history and physical exam as documented by PA/ARNP/Resident and agree with above documentation.  I have discussed and formulated the above plan as documented. Edits to the note have been made as needed.  Varian Innes, MD [facility]

## 2024-09-04 NOTE — H&P (Signed)
 History and Physical    Patient: Dawn [name] FMW:[id number] DOB: May 22, 1935 DOA: 09/04/2024 DOS: the patient was seen and examined on 09/04/2024 PCP: Sherrilee Graven, MD  Patient coming from: Home  Chief Complaint:  Chief Complaint  Patient presents with   Seizures   HPI: Dawn [name] is a 89 y.o. female with medical history significant of DM2, HTN and seizure disorder (dx in 08/2023 iso PRES and cerebral amyloid angiopathy on MRI brain; pt and family eventually opted for OP hospice) p/w acute encephalopathy iso presumed seizure.  Pt unable to provide a medical history. From what I can gather, presented to the ED by EMS for altered mental status and reported seizure activity.  She lives at home with family members, was last seen normal this morning, though family states that she did not sleep overnight, does have a known seizure history and takes Keppra  daily for the same.  It was reported by the patient's son that she did not take her Keppra  dose this morning, followed by having seizures off and on for approximately an hour prior to EMS arrival.  Further noted that patient has a known history of of dementia.  Prior to her arrival here in the ED she was given 5 mg of midazolam by EMS.  Have been made DNR/DNI after last admission in October 2024, placed on hospice but per son was discharged from hospice as they felt that she was improving.  In the ED, pt somnolent and unable to provide a medical history. Labs unremarkable. CTA head/neck showed No large vessel occlusion of the arteries in the head and neck, but was c/f filling defects in the right pulmonary artery and right lobar branches concerning for pulmonary emboli. EDP loaded pt with Keppra , consulted Neurology and requested medicine admission.   Review of Systems: As mentioned in the history of present illness. All other systems reviewed and are negative. Past Medical History:  Diagnosis Date   Asthma    Diabetes  mellitus without complication (HCC)    Hypertension    Past Surgical History:  Procedure Laterality Date   BOWEL RESECTION  11/12/2012   Procedure: SMALL BOWEL RESECTION;  Surgeon: Morene ONEIDA Olives, MD;  Location: [facility] OR;  Service: General;  Laterality: N/A;   LAPAROTOMY  11/12/2012   Procedure: EXPLORATORY LAPAROTOMY;  Surgeon: Morene ONEIDA Olives, MD;  Location: [facility] OR;  Service: General;  Laterality: N/A;   Social History:  reports that she has quit smoking. She quit smokeless tobacco use about 31 years ago. She reports that she does not drink alcohol and does not use drugs.  Allergies  Allergen Reactions   Penicillins Anaphylaxis    Has patient had a PCN reaction causing immediate rash, facial/tongue/throat swelling, SOB or lightheadedness with hypotension: Yes Has patient had a PCN reaction causing severe rash involving mucus membranes or skin necrosis: Yes Has patient had a PCN reaction that required hospitalization No Has patient had a PCN reaction occurring within the last 10 years: No If all of the above answers are NO, then may proceed with Cephalosporin use.   Shellfish Allergy Anaphylaxis and Swelling    Family History  Problem Relation Age of Onset   Cancer Mother    Heart failure Father     Prior to Admission medications   Medication Sig Start Date End Date Taking? Authorizing Provider  irbesartan  (AVAPRO ) 150 MG tablet Take 1 tablet (150 mg total) by mouth daily. 09/07/23  Yes Tharon Lung, MD  levETIRAcetam  (KEPPRA ) 500 MG  tablet Take 1 tablet (500 mg total) by mouth 2 (two) times daily. 09/06/23  Yes Mabe, Elna, MD  traMADol  (ULTRAM ) 50 MG tablet Take 25 mg by mouth every 6 (six) hours as needed (for pain).   Yes [provider]  feeding supplement (ENSURE ENLIVE / ENSURE PLUS) LIQD Take 237 mLs by mouth 3 (three) times daily between meals. Patient not taking: Reported on 09/04/2024 09/06/23   Tharon Elna, MD    Physical Exam: Vitals:   09/04/24  1245 09/04/24 1300 09/04/24 1315 09/04/24 1430  BP: (!) 210/117 (!) 210/115 (!) 203/113 (!) 206/111  Pulse: 81 78 79 81  Resp: (!) 22 19 18 16   Temp:      TempSrc:      SpO2: 97% 98% 97% 96%   General: Somnolent and lethargic; no acute distress Respiratory: Lungs clear to auscultation bilaterally with normal respiratory effort; no w/r/r Cardiovascular: Regular rate and rhythm w/o m/r/g   Data Reviewed:  Lab Results  Component Value Date   WBC 4.4 09/04/2024   HGB 13.4 09/04/2024   HCT 40.8 09/04/2024   MCV 91.3 09/04/2024   PLT 246 09/04/2024   Lab Results  Component Value Date   GLUCOSE 121 (H) 09/04/2024   CALCIUM 9.7 09/06/2023   NA 142 09/04/2024   K 4.4 09/04/2024   CO2 27 09/06/2023   CL 102 09/04/2024   BUN 7 (L) 09/04/2024   CREATININE 1.00 09/04/2024   Lab Results  Component Value Date   ALT 15 09/01/2023   AST 24 09/01/2023   ALKPHOS 37 (L) 09/01/2023   BILITOT 0.8 09/01/2023   Lab Results  Component Value Date   INR 1.6 (H) 09/04/2024   INR 1.0 09/01/2023   INR 1.07 11/11/2012   Radiology: [facility] Chest Port 1 View Result Date: 09/04/2024 CLINICAL DATA:  Seizures. EXAM: PORTABLE CHEST 1 VIEW COMPARISON:  September 01, 2023 FINDINGS: The cardiac silhouette is enlarged and unchanged in size. There is mild to moderate severity calcification of the aortic arch. A 13 mm mildly elongated nodular opacity is seen overlying the upper left lung. This represents a new finding when compared to the prior study. Low lung volumes are noted with mild elevation of the right hemithorax. Mild right basilar atelectasis is noted. No pleural effusion or pneumothorax is identified. Multilevel degenerative changes are present throughout the thoracic spine. IMPRESSION: 1. 13 mm mildly elongated nodular opacity overlying the upper left lung. Further evaluation with nonemergent chest CT is recommended. 2. Low lung volumes with mild right basilar atelectasis. Electronically Signed   By:  Suzen Dials M.D.   On: 09/04/2024 13:09   CT ANGIO HEAD NECK W WO CM Result Date: 09/04/2024 EXAM: CTA HEAD AND NECK WITH AND WITHOUT 09/04/2024 12:30:07 PM TECHNIQUE: CTA of the head and neck was performed with and without the administration of 75 mL of iohexol  (OMNIPAQUE ) 350 MG/ML injection. Multiplanar 2D and/or 3D reformatted images are provided for review. Automated exposure control, iterative reconstruction, and/or weight based adjustment of the mA/kV was utilized to reduce the radiation dose to as low as reasonably achievable. Stenosis of the internal carotid arteries measured using NASCET criteria. COMPARISON: MRI head 09/02/2023 and CTA head and neck 09/01/2023 CLINICAL HISTORY: Mental status change, unknown cause. Patient BIB EMS from home c/o seizures. Patient does have a history of seizures. Patient's husband states that she was having seizures on and off for about an hour. Patient is on Keppra  and took that this morning. Patient also has  new onset dementia. Patient has a right bundle block seen on EKG. Patient was given 5mg  of Midazolam for seizures with EMS. Patient's family states that it is normal for her to become unresponsive after seizures. FINDINGS: CTA NECK: AORTIC ARCH AND ARCH VESSELS: No dissection or arterial injury. Mild atherosclerosis of the proximal subclavian arteries without significant stenosis. No significant stenosis of the brachiocephalic arteries. CERVICAL CAROTID ARTERIES: The carotid arteries in the neck are slightly limited by motion artifact. Within these limitations, there is no evidence of occlusion. Mild atherosclerosis at the carotid bifurcations without evidence of hemodynamically significant stenosis by NASCET criteria. No dissection or arterial injury. CERVICAL VERTEBRAL ARTERIES: The left vertebral artery is dominant. Mild atherosclerosis at the left vertebral artery origin resulting in mild stenosis. The vertebral arteries are patent to the vertebrobasilar  confluence. No dissection or arterial injury. LUNGS AND MEDIASTINUM: There are filling defects within the distal aspect of the right pulmonary artery extending into multiple partially visualized lobar branches compatible with pulmonary emboli. SOFT TISSUES: Enlargement of the left thyroid  lobe with multiple nodules measuring up to 4 cm in diameter which is similar to the prior CTA. There is slight lateral displacement of the left common carotid artery which is similar to prior. BONES: Degenerative changes in the visualized spine. Edentulous maxilla and mandible. CTA HEAD: ANTERIOR CIRCULATION: The intracranial internal carotid arteries are patent bilaterally. Mild atherosclerosis of the carotid siphons without high grade stenosis. The middle cerebral arteries are patent bilaterally. The anterior cerebral arteries are patent bilaterally. No aneurysm. POSTERIOR CIRCULATION: No significant stenosis of the posterior cerebral arteries. No significant stenosis of the basilar artery. No significant stenosis of the vertebral arteries. No aneurysm. OTHER: Nonspecific hypoattenuation in the periventricular and subcortical white matter, most likely representing chronic microvascular ischemic changes. Mild parenchymal volume loss. Chronic bowing deformity of the left zygomatic arch. No dural venous sinus thrombosis on this non-dedicated study. IMPRESSION: 1. No large vessel occlusion of the arteries in the head and neck. 2. Filling defects in the right pulmonary artery and right lobar branches concerning for pulmonary emboli. Recommend dedicated CTA chest for further evaluation. 3. Mild atherosclerosis of the carotid bifurcations without hemodynamically significant stenosis. 4. Mild atherosclerosis at the left vertebral artery origin resulting in mild stenosis. 5. Enlargement of the left thyroid  lobe with multiple nodules measuring up to 4 cm in diameter, similar to prior study, causing slight lateral displacement of the left  common carotid artery, also similar to prior study. Electronically signed by: Donnice Mania MD 09/04/2024 12:59 PM EDT RP Workstation: [id number]    Assessment and Plan: 69F h/o DM2, HTN and seizure disorder (dx in 08/2023 iso PRES and cerebral amyloid angiopathy on MRI brain; pt and family eventually opted for OP hospice) p/w acute encephalopathy iso presumed seizure.  Acute encephalopathy Seizure disorder PTA Keppra  500mg  BID -Neurology consulted; apprec eval/recs -IV Keppra  per EDP and Neuro -F/u MRI brain WO given h/o amyloid angiopathy -Consider re-engaging hospice for GOC  Imaging c/f acute PE -F/u [place] and CTA chest PE protocol; if positive, will need to discuss starting hep gtt with Neuro given h/o of amyloid angiopathy  Thyroid  mass Present since 08/2023 -Consider OP FNA   Advance Care Planning:   Code Status: Limited: Do not attempt resuscitation (DNR) -DNR-LIMITED -Do Not Intubate/DNI    Consults: Neurology  Family Communication: Son  Severity of Illness: The appropriate patient status for this patient is INPATIENT. Inpatient status is judged to be reasonable and necessary in order to provide  the required intensity of service to ensure the patient's safety. The patient's presenting symptoms, physical exam findings, and initial radiographic and laboratory data in the context of their chronic comorbidities is felt to place them at high risk for further clinical deterioration. Furthermore, it is not anticipated that the patient will be medically stable for discharge from the hospital within 2 midnights of admission.   * I certify that at the point of admission it is my clinical judgment that the patient will require inpatient hospital care spanning beyond 2 midnights from the point of admission due to high intensity of service, high risk for further deterioration and high frequency of surveillance required.*   ------- I spent 61 minutes reviewing previous notes, at the  bedside counseling/discussing the treatment plan, and performing clinical documentation.  Author: Marsha Ada, MD 09/04/2024 2:58 PM  For on call review [email or web address].

## 2024-09-04 NOTE — ED Provider Notes (Signed)
 [place] EMERGENCY DEPARTMENT AT [facility] Provider Note   CSN: [id number] Arrival date & time: 09/04/24  1138     Patient presents with: Seizures   Dawn [name] is a 89 y.o. female who presented to the ED by EMS for altered mental status and reported seizure activity.  She lives at home with family members, was last seen normal this morning, though family states that she did not sleep overnight, does have a known seizure history and takes Keppra  daily for the same.  It was reported by the patient's son that she did not take her Keppra  dose this morning, followed by having seizures off and on for approximately an hour prior to EMS arrival.  Further noted that patient has a known history of of dementia.  Prior to her arrival here in the ED she was given 5 mg of midazolam by EMS.  Have been made DNR/DNI after last admission in October 2024, placed on hospice but per son was discharged from hospice as they felt that she was improving.  Further as previous medical diagnosis of thyroid  mass, severe protein calorie malnutrition, type 2 diabetes, hypertension.    Seizures      Prior to Admission medications   Medication Sig Start Date End Date Taking? Authorizing Provider  irbesartan  (AVAPRO ) 150 MG tablet Take 1 tablet (150 mg total) by mouth daily. 09/07/23  Yes Mabe, Elna, MD  levETIRAcetam  (KEPPRA ) 500 MG tablet Take 1 tablet (500 mg total) by mouth 2 (two) times daily. 09/06/23  Yes Mabe, Elna, MD  traMADol  (ULTRAM ) 50 MG tablet Take 25 mg by mouth every 6 (six) hours as needed (for pain).   Yes [provider]  feeding supplement (ENSURE ENLIVE / ENSURE PLUS) LIQD Take 237 mLs by mouth 3 (three) times daily between meals. Patient not taking: Reported on 09/04/2024 09/06/23   Tharon Elna, MD    Allergies: Penicillins and Shellfish allergy    Review of Systems  Constitutional:  Positive for activity change.  Neurological:  Positive for seizures and  weakness.  All other systems reviewed and are negative.   Updated Vital Signs BP (!) 206/111   Pulse 81   Temp 98.1 F (36.7 C) (Axillary)   Resp 16   SpO2 96%   Physical Exam Vitals and nursing note reviewed.  Constitutional:      General: She is in acute distress.     Appearance: She is underweight. She is ill-appearing.     Interventions: Nasal cannula in place.  HENT:     Head: Normocephalic and atraumatic.     Mouth/Throat:     Mouth: Mucous membranes are dry.     Pharynx: Oropharynx is clear. Uvula midline.  Eyes:     General: Lids are normal.     Extraocular Movements: Extraocular movements intact.     Conjunctiva/sclera: Conjunctivae normal.     Comments: Noted fixed right deviated gaze.  Initially pupils are pinpoint.  Cardiovascular:     Rate and Rhythm: Normal rate and regular rhythm.     Pulses: Normal pulses.     Heart sounds: Normal heart sounds. No murmur heard.    No friction rub. No gallop.  Pulmonary:     Effort: Pulmonary effort is normal.     Breath sounds: Normal breath sounds.  Abdominal:     General: Abdomen is flat. Bowel sounds are normal.     Palpations: Abdomen is soft.  Musculoskeletal:        General:  Normal range of motion.     Cervical back: Normal range of motion and neck supple.     Right lower leg: No edema.     Left lower leg: No edema.  Skin:    General: Skin is warm and dry.     Capillary Refill: Capillary refill takes less than 2 seconds.  Neurological:     General: No focal deficit present.     Mental Status: She is lethargic and disoriented.     GCS: GCS eye subscore is 2. GCS verbal subscore is 2. GCS motor subscore is 5.  Psychiatric:        Mood and Affect: Mood normal.     (all labs ordered are listed, but only abnormal results are displayed) Labs Reviewed  CBC WITH DIFFERENTIAL/PLATELET - Abnormal; Notable for the following components:      Result Value   Lymphs Abs 0.4 (*)    All other components within normal  limits  AMMONIA - Abnormal; Notable for the following components:   Ammonia 45 (*)    All other components within normal limits  PROTIME-INR - Abnormal; Notable for the following components:   Prothrombin Time 19.4 (*)    INR 1.6 (*)    All other components within normal limits  APTT - Abnormal; Notable for the following components:   aPTT 23 (*)    All other components within normal limits  CBG MONITORING, ED - Abnormal; Notable for the following components:   Glucose-Capillary 112 (*)    All other components within normal limits  I-STAT CHEM 8, ED - Abnormal; Notable for the following components:   BUN 7 (*)    Glucose, Bld 121 (*)    All other components within normal limits  LACTIC ACID, PLASMA  URINALYSIS, ROUTINE W REFLEX MICROSCOPIC  RAPID URINE DRUG SCREEN, HOSP PERFORMED  ETHANOL  COMPREHENSIVE METABOLIC PANEL WITH GFR  CBC  D-DIMER, QUANTITATIVE    EKG: EKG Interpretation Date/Time:  Monday September 04 2024 11:45:36 EDT Ventricular Rate:  97 PR Interval:  177 QRS Duration:  148 QT Interval:  422 QTC Calculation: 537 R Axis:   -83  Text Interpretation: Sinus or ectopic atrial rhythm Ventricular premature complex Right bundle branch block Left ventricular hypertrophy Anterior infarct, age indeterminate Confirmed by Francesca Fallow ([id number]) on 09/04/2024 12:36:05 PM  Radiology: [facility] Chest Port 1 View Result Date: 09/04/2024 CLINICAL DATA:  Seizures. EXAM: PORTABLE CHEST 1 VIEW COMPARISON:  September 01, 2023 FINDINGS: The cardiac silhouette is enlarged and unchanged in size. There is mild to moderate severity calcification of the aortic arch. A 13 mm mildly elongated nodular opacity is seen overlying the upper left lung. This represents a new finding when compared to the prior study. Low lung volumes are noted with mild elevation of the right hemithorax. Mild right basilar atelectasis is noted. No pleural effusion or pneumothorax is identified. Multilevel degenerative  changes are present throughout the thoracic spine. IMPRESSION: 1. 13 mm mildly elongated nodular opacity overlying the upper left lung. Further evaluation with nonemergent chest CT is recommended. 2. Low lung volumes with mild right basilar atelectasis. Electronically Signed   By: Suzen Dials M.D.   On: 09/04/2024 13:09   CT ANGIO HEAD NECK W WO CM Result Date: 09/04/2024 EXAM: CTA HEAD AND NECK WITH AND WITHOUT 09/04/2024 12:30:07 PM TECHNIQUE: CTA of the head and neck was performed with and without the administration of 75 mL of iohexol  (OMNIPAQUE ) 350 MG/ML injection. Multiplanar 2D and/or 3D reformatted images  are provided for review. Automated exposure control, iterative reconstruction, and/or weight based adjustment of the mA/kV was utilized to reduce the radiation dose to as low as reasonably achievable. Stenosis of the internal carotid arteries measured using NASCET criteria. COMPARISON: MRI head 09/02/2023 and CTA head and neck 09/01/2023 CLINICAL HISTORY: Mental status change, unknown cause. Patient BIB EMS from home c/o seizures. Patient does have a history of seizures. Patient's husband states that she was having seizures on and off for about an hour. Patient is on Keppra  and took that this morning. Patient also has new onset dementia. Patient has a right bundle block seen on EKG. Patient was given 5mg  of Midazolam for seizures with EMS. Patient's family states that it is normal for her to become unresponsive after seizures. FINDINGS: CTA NECK: AORTIC ARCH AND ARCH VESSELS: No dissection or arterial injury. Mild atherosclerosis of the proximal subclavian arteries without significant stenosis. No significant stenosis of the brachiocephalic arteries. CERVICAL CAROTID ARTERIES: The carotid arteries in the neck are slightly limited by motion artifact. Within these limitations, there is no evidence of occlusion. Mild atherosclerosis at the carotid bifurcations without evidence of hemodynamically  significant stenosis by NASCET criteria. No dissection or arterial injury. CERVICAL VERTEBRAL ARTERIES: The left vertebral artery is dominant. Mild atherosclerosis at the left vertebral artery origin resulting in mild stenosis. The vertebral arteries are patent to the vertebrobasilar confluence. No dissection or arterial injury. LUNGS AND MEDIASTINUM: There are filling defects within the distal aspect of the right pulmonary artery extending into multiple partially visualized lobar branches compatible with pulmonary emboli. SOFT TISSUES: Enlargement of the left thyroid  lobe with multiple nodules measuring up to 4 cm in diameter which is similar to the prior CTA. There is slight lateral displacement of the left common carotid artery which is similar to prior. BONES: Degenerative changes in the visualized spine. Edentulous maxilla and mandible. CTA HEAD: ANTERIOR CIRCULATION: The intracranial internal carotid arteries are patent bilaterally. Mild atherosclerosis of the carotid siphons without high grade stenosis. The middle cerebral arteries are patent bilaterally. The anterior cerebral arteries are patent bilaterally. No aneurysm. POSTERIOR CIRCULATION: No significant stenosis of the posterior cerebral arteries. No significant stenosis of the basilar artery. No significant stenosis of the vertebral arteries. No aneurysm. OTHER: Nonspecific hypoattenuation in the periventricular and subcortical white matter, most likely representing chronic microvascular ischemic changes. Mild parenchymal volume loss. Chronic bowing deformity of the left zygomatic arch. No dural venous sinus thrombosis on this non-dedicated study. IMPRESSION: 1. No large vessel occlusion of the arteries in the head and neck. 2. Filling defects in the right pulmonary artery and right lobar branches concerning for pulmonary emboli. Recommend dedicated CTA chest for further evaluation. 3. Mild atherosclerosis of the carotid bifurcations without  hemodynamically significant stenosis. 4. Mild atherosclerosis at the left vertebral artery origin resulting in mild stenosis. 5. Enlargement of the left thyroid  lobe with multiple nodules measuring up to 4 cm in diameter, similar to prior study, causing slight lateral displacement of the left common carotid artery, also similar to prior study. Electronically signed by: Donnice Mania MD 09/04/2024 12:59 PM EDT RP Workstation: [id number]     .Critical Care  Performed by: Myriam Dorn BROCKS, PA Authorized by: Myriam Dorn BROCKS, PA   Critical care provider statement:    Critical care time (minutes):  30   Critical care time was exclusive of:  Separately billable procedures and treating other patients   Critical care was necessary to treat or prevent imminent or life-threatening deterioration of  the following conditions:  CNS failure or compromise   Critical care was time spent personally by me on the following activities:  Development of treatment plan with patient or surrogate, discussions with consultants, evaluation of patient's response to treatment, examination of patient, ordering and performing treatments and interventions, ordering and review of laboratory studies, ordering and review of radiographic studies, re-evaluation of patient's condition and review of old charts   Care discussed with: admitting provider      Medications Ordered in the ED  levETIRAcetam  (KEPPRA ) undiluted injection 750 mg (has no administration in time range)  enoxaparin  (LOVENOX ) injection 40 mg (has no administration in time range)  naloxone [facility]) injection 2 mg (2 mg Intravenous Given 09/04/24 1158)  iohexol  (OMNIPAQUE ) 350 MG/ML injection 75 mL (75 mLs Intravenous Contrast Given 09/04/24 1230)  LORazepam (ATIVAN) injection 2 mg (2 mg Intravenous Given 09/04/24 1250)  levETIRAcetam  (KEPPRA ) undiluted injection 2,000 mg (2,000 mg Intravenous Given 09/04/24 1250)  labetalol (NORMODYNE) injection 5 mg (5 mg  Intravenous Given 09/04/24 1444)  iohexol  (OMNIPAQUE ) 350 MG/ML injection 60 mL (60 mLs Intravenous Contrast Given 09/04/24 1404)                                    Medical Decision Making Amount and/or Complexity of Data Reviewed Labs: ordered. Radiology: ordered.  Risk Prescription drug management. Decision regarding hospitalization.   Medical Decision Making:   JAICEE MICHELOTTI is a 89 y.o. female who presented to the ED today with seizures and altered mental status detailed above.    Additional history discussed with patient's family/caregivers.  External chart has been reviewed including previous labs, imaging, admission records. Patient's presentation is complicated by their history of dementia, seizure history.  Patient placed on continuous vitals and telemetry monitoring while in ED which was reviewed periodically.  Complete initial physical exam performed, notably the patient  was not responsive except to painful stimuli with grimacing initially.   Physical exam as noted Reviewed and confirmed nursing documentation for past medical history, family history, social history.    Initial Assessment:   With the patient's presentation of seizure and altered mental status, consider differential diagnosis of acute neurovascular insult, acute on chronic seizures This is most consistent with an acute complicated illness  Initial Plan:  After consultation w/ Dr Francesca, Code Stroke activated  Secondary to pinpoint pupils on physical exam and altered mental status, provide 2 mg of Narcan Screening labs including CBC and Metabolic panel to evaluate for infectious or metabolic etiology of disease.  Urinalysis with reflex culture ordered to evaluate for UTI or relevant urologic/nephrologic pathology.  CXR to evaluate for structural/infectious intrathoracic pathology.  EKG to evaluate for cardiac pathology Objective evaluation as below reviewed   Initial Study Results:   Laboratory   All laboratory results reviewed without evidence of clinically relevant pathology.   Exceptions include: INR is 1.6, APTT 23, ammonia is 45  EKG EKG was reviewed independently. Rate, rhythm, axis, intervals all examined and without medically relevant abnormality. ST segments without concerns for elevations.    Radiology:  All images reviewed independently. Agree with radiology report at this time.   DG Chest Port 1 View Result Date: 09/04/2024 CLINICAL DATA:  Seizures. EXAM: PORTABLE CHEST 1 VIEW COMPARISON:  September 01, 2023 FINDINGS: The cardiac silhouette is enlarged and unchanged in size. There is mild to moderate severity calcification of the aortic arch. A 13 mm mildly  elongated nodular opacity is seen overlying the upper left lung. This represents a new finding when compared to the prior study. Low lung volumes are noted with mild elevation of the right hemithorax. Mild right basilar atelectasis is noted. No pleural effusion or pneumothorax is identified. Multilevel degenerative changes are present throughout the thoracic spine. IMPRESSION: 1. 13 mm mildly elongated nodular opacity overlying the upper left lung. Further evaluation with nonemergent chest CT is recommended. 2. Low lung volumes with mild right basilar atelectasis. Electronically Signed   By: Suzen Dials M.D.   On: 09/04/2024 13:09   CT ANGIO HEAD NECK W WO CM Result Date: 09/04/2024 EXAM: CTA HEAD AND NECK WITH AND WITHOUT 09/04/2024 12:30:07 PM TECHNIQUE: CTA of the head and neck was performed with and without the administration of 75 mL of iohexol  (OMNIPAQUE ) 350 MG/ML injection. Multiplanar 2D and/or 3D reformatted images are provided for review. Automated exposure control, iterative reconstruction, and/or weight based adjustment of the mA/kV was utilized to reduce the radiation dose to as low as reasonably achievable. Stenosis of the internal carotid arteries measured using NASCET criteria. COMPARISON: MRI head  09/02/2023 and CTA head and neck 09/01/2023 CLINICAL HISTORY: Mental status change, unknown cause. Patient BIB EMS from home c/o seizures. Patient does have a history of seizures. Patient's husband states that she was having seizures on and off for about an hour. Patient is on Keppra  and took that this morning. Patient also has new onset dementia. Patient has a right bundle block seen on EKG. Patient was given 5mg  of Midazolam for seizures with EMS. Patient's family states that it is normal for her to become unresponsive after seizures. FINDINGS: CTA NECK: AORTIC ARCH AND ARCH VESSELS: No dissection or arterial injury. Mild atherosclerosis of the proximal subclavian arteries without significant stenosis. No significant stenosis of the brachiocephalic arteries. CERVICAL CAROTID ARTERIES: The carotid arteries in the neck are slightly limited by motion artifact. Within these limitations, there is no evidence of occlusion. Mild atherosclerosis at the carotid bifurcations without evidence of hemodynamically significant stenosis by NASCET criteria. No dissection or arterial injury. CERVICAL VERTEBRAL ARTERIES: The left vertebral artery is dominant. Mild atherosclerosis at the left vertebral artery origin resulting in mild stenosis. The vertebral arteries are patent to the vertebrobasilar confluence. No dissection or arterial injury. LUNGS AND MEDIASTINUM: There are filling defects within the distal aspect of the right pulmonary artery extending into multiple partially visualized lobar branches compatible with pulmonary emboli. SOFT TISSUES: Enlargement of the left thyroid  lobe with multiple nodules measuring up to 4 cm in diameter which is similar to the prior CTA. There is slight lateral displacement of the left common carotid artery which is similar to prior. BONES: Degenerative changes in the visualized spine. Edentulous maxilla and mandible. CTA HEAD: ANTERIOR CIRCULATION: The intracranial internal carotid arteries  are patent bilaterally. Mild atherosclerosis of the carotid siphons without high grade stenosis. The middle cerebral arteries are patent bilaterally. The anterior cerebral arteries are patent bilaterally. No aneurysm. POSTERIOR CIRCULATION: No significant stenosis of the posterior cerebral arteries. No significant stenosis of the basilar artery. No significant stenosis of the vertebral arteries. No aneurysm. OTHER: Nonspecific hypoattenuation in the periventricular and subcortical white matter, most likely representing chronic microvascular ischemic changes. Mild parenchymal volume loss. Chronic bowing deformity of the left zygomatic arch. No dural venous sinus thrombosis on this non-dedicated study. IMPRESSION: 1. No large vessel occlusion of the arteries in the head and neck. 2. Filling defects in the right pulmonary artery and right  lobar branches concerning for pulmonary emboli. Recommend dedicated CTA chest for further evaluation. 3. Mild atherosclerosis of the carotid bifurcations without hemodynamically significant stenosis. 4. Mild atherosclerosis at the left vertebral artery origin resulting in mild stenosis. 5. Enlargement of the left thyroid  lobe with multiple nodules measuring up to 4 cm in diameter, similar to prior study, causing slight lateral displacement of the left common carotid artery, also similar to prior study. Electronically signed by: Donnice Mania MD 09/04/2024 12:59 PM EDT RP Workstation: [id number]      Consults: Case discussed with neurology as well as Dr. Georgina with hospitalist team.   Reassessment and Plan:   After administration of naloxone, patient did have improvement of her mental status to where she would open her eyes voluntarily, had a fixed right sided gaze though pupils were now equal and reactive bilaterally.  Still had generalized weakness, though upper and lower right-sided extremities were flaccid.  Head imaging did not show any acute neurovascular insult however did  reveal presence of pulmonary embolus.  Follow-up imaging of the chest was obtained.  Based on her decreased mentation as well as complex medical history and clinical picture, plan at this time is to admit for continued monitoring of her neurologic status, neurology will be following with medicine admitting hence consultation with hospitalist team for admission.  She has received a total of 2 g of Keppra , 2 mg of lorazepam, and for her hypertension has received 5 mg IV labetalol.  Has had some improvement in her condition however continues to have decreased mentation, and imaging concerning findings of pulmonary embolus.  Consulted to hospitalist team accepts patient for admission and continued management.       Final diagnoses:  Seizure (HCC)  Altered mental status, unspecified altered mental status type  Acute pulmonary embolism, unspecified pulmonary embolism type, unspecified whether acute cor pulmonale present [facility])    ED Discharge Orders     None          Myriam Dorn BROCKS, PA 09/04/24 1511    Francesca Elsie CROME, MD 09/05/24 [phone]

## 2024-09-04 NOTE — Code Documentation (Signed)
 Stroke Response Nurse Documentation Code Documentation  Dawn [name] is a 89 y.o. female arriving to [facility]  via [facility] on 09/04/2024 with past medical hx of seizures. On No antithrombotic. Code stroke was activated by ED.   Patient from home where she was LKW at 1900 yesterday and now complaining of seizures.  NIHSS 23, see documentation for details and code stroke times. Patient with disoriented, not following commands, right gaze preference , bilateral arm weakness, bilateral leg weakness, and Global aphasia  on exam. The following imaging was completed:  CTA. Patient is not a candidate for IV Thrombolytic due to OOW. Patient is not a candidate for IR due to LVO neg on CTA.   Care Plan:   No acute treatment/TIA alert: q2h x 12 hours NIHSS & VS, then q4h   Bedside handoff with ED RN Little.    Kei Mcelhiney Livengood  Stroke Response RN

## 2024-09-05 ENCOUNTER — Inpatient Hospital Stay (HOSPITAL_COMMUNITY)

## 2024-09-05 DIAGNOSIS — I2602 Saddle embolus of pulmonary artery with acute cor pulmonale: Secondary | ICD-10-CM

## 2024-09-05 DIAGNOSIS — Z515 Encounter for palliative care: Secondary | ICD-10-CM | POA: Diagnosis not present

## 2024-09-05 DIAGNOSIS — Z7189 Other specified counseling: Secondary | ICD-10-CM | POA: Diagnosis not present

## 2024-09-05 DIAGNOSIS — R569 Unspecified convulsions: Secondary | ICD-10-CM | POA: Diagnosis not present

## 2024-09-05 LAB — COMPREHENSIVE METABOLIC PANEL WITH GFR
ALT: 10 U/L (ref 0–44)
AST: 22 U/L (ref 15–41)
Albumin: 3.8 g/dL (ref 3.5–5.0)
Alkaline Phosphatase: 51 U/L (ref 38–126)
Anion gap: 14 (ref 5–15)
BUN: <5 mg/dL — ABNORMAL LOW (ref 8–23)
CO2: 26 mmol/L (ref 22–32)
Calcium: 9.8 mg/dL (ref 8.9–10.3)
Chloride: 100 mmol/L (ref 98–111)
Creatinine, Ser: 0.93 mg/dL (ref 0.44–1.00)
GFR, Estimated: 59 mL/min — ABNORMAL LOW (ref >60)
Glucose, Bld: 109 mg/dL — ABNORMAL HIGH (ref 70–99)
Potassium: 4.1 mmol/L (ref 3.5–5.1)
Sodium: 140 mmol/L (ref 135–145)
Total Bilirubin: 0.9 mg/dL (ref 0.0–1.2)
Total Protein: 7.7 g/dL (ref 6.5–8.1)

## 2024-09-05 LAB — ECHOCARDIOGRAM COMPLETE
AR max vel: 1.65 cm2
AV Area VTI: 1.54 cm2
AV Area mean vel: 1.71 cm2
AV Mean grad: 6.3 mmHg
AV Peak grad: 13.2 mmHg
Ao pk vel: 1.81 m/s
Area-P 1/2: 2.42 cm2
Calc EF: 54 %
S' Lateral: 2.7 cm
Single Plane A2C EF: 57.6 %
Single Plane A4C EF: 53.3 %
Weight: 2028.23 [oz_av]

## 2024-09-05 MED ORDER — DEXTROSE IN LACTATED RINGERS 5 % IV SOLN: INTRAVENOUS | Status: AC

## 2024-09-05 MED ORDER — LABETALOL HCL 5 MG/ML IV SOLN
10.0000 mg | INTRAVENOUS | Status: AC | PRN
Start: 2024-09-05 — End: ?
  Administered 2024-09-05 – 2024-09-07 (×2): 10 mg via INTRAVENOUS
  Filled 2024-09-05 (×3): qty 4

## 2024-09-05 NOTE — Progress Notes (Signed)
  Echocardiogram 2D Echocardiogram has been performed.  Dawn [name] 09/05/2024, 3:46 PM

## 2024-09-05 NOTE — Progress Notes (Addendum)
 NEUROLOGY CONSULT FOLLOW UP NOTE   Date of service: September 05, 2024 Patient Name: Dawn [name] MRN:  [id number] DOB:  05/31/1935  Interval Hx/subjective   No family at the bedside. Patient is laying in the bed in NAD. NO new neurological events overnight.  On exam: eyes are closed, she arouses and moans to stimuli and will partially open her eyes. No verbal output and no following commands. She withdraws in all 4 extremities.  Spoke with son, Dawn [name] on the phone and he states that normally she is wheelchair bound and needs assistance with bathroom and dressing but can feed herself and is usually able to verbally communicate; confused but able to hold a conversation   Vitals   Vitals:   09/05/24 0600 09/05/24 0733 09/05/24 0814 09/05/24 1000  BP: (!) 140/72 (!) 157/75 (!) 149/68 131/72  Pulse: 67 60 63   Resp:  19 20   Temp:  99.1 F (37.3 C)    TempSrc:  Oral    SpO2: 100% 100% 98% 98%  Weight:         Body mass index is 21.76 kg/m.  Physical Exam   Constitutional: elderly female Psych:unable to assess  Eyes: No scleral injection.   HENT: No OP obstrucion.   Head: Normocephalic.   Cardiovascular: Normal rate and regular rhythm.   Respiratory: Effort normal, non-labored breathing.   GI: Soft.  No distension. There is no tenderness.   Skin: WDI.    Neurologic Examination   Mental Status -  Eyes are closed, does not open eyes to voice. Opens eyes to sternal rub. Does not follow commands   Cranial Nerves II - XII - II - Visual field inconsistent blink to threat  III, IV, VI - midline V - unable to assess  VII - Facial movement intact bilaterally . VIII - Hearing & vestibular intact bilaterally . XI - unable to assess  XII - unable to assess    Motor Strength - patient responds to painful stimuli in all 4 extremities with  withdrawal .  Bulk was normal and fasciculations were absent .   Motor Tone - Muscle tone was assessed at the neck and appendages  and was normal . Sensory - moans to noxious stimuli  Coordination - unable to assess  Gait and Station - deferred.  Medications  Current Facility-Administered Medications:    dextrose  5 % in lactated ringers  infusion, , Intravenous, Continuous, Adhikari, Amrit, MD   labetalol (NORMODYNE) injection 10 mg, 10 mg, Intravenous, Q2H PRN, Segars, Dorn, MD, 10 mg at 09/05/24 0227   levETIRAcetam  (KEPPRA ) undiluted injection 750 mg, 750 mg, Intravenous, Q12H, [name] Aquas A, NP, 750 mg at 09/05/24 1041  Labs and Diagnostic Imaging   CBC:  Recent Labs  Lab 09/04/24 1223 09/04/24 2046  WBC 4.4 5.3  NEUTROABS 3.8  --   HGB 13.4 14.9  HCT 40.8 45.6  MCV 91.3 91.9  PLT 246 157    Basic Metabolic Panel:  Lab Results  Component Value Date   NA 140 09/04/2024   K 4.1 09/04/2024   CO2 26 09/04/2024   GLUCOSE 109 (H) 09/04/2024   BUN <5 (L) 09/04/2024   CREATININE 0.93 09/04/2024   CALCIUM 9.8 09/04/2024   GFRNONAA 59 (L) 09/04/2024   GFRAA 58 (L) 02/09/2016   Lipid Panel: No results found for: LDLCALC HgbA1c: No results found for: HGBA1C Urine Drug Screen:     Component Value Date/Time   LABOPIA NONE DETECTED 09/04/2024 1528  COCAINSCRNUR NONE DETECTED 09/04/2024 1528   LABBENZ POSITIVE (A) 09/04/2024 1528   AMPHETMU NONE DETECTED 09/04/2024 1528   THCU NONE DETECTED 09/04/2024 1528   LABBARB NONE DETECTED 09/04/2024 1528    Alcohol Level     Component Value Date/Time   ETH <15 09/04/2024 2046   INR  Lab Results  Component Value Date   INR 1.6 (H) 09/04/2024   APTT  Lab Results  Component Value Date   APTT 23 (L) 09/04/2024   AED levels: No results found for: PHENYTOIN, ZONISAMIDE, LAMOTRIGINE, LEVETIRACETA   CT angio Head and Neck with contrast(Personally reviewed): NO LVO.  Filling defects in the right pulmonary artery and right lobar branches concerning for pulmonary emboli.   Labs Ammonia 45  UA negative   Assessment   Dawn [name] is a 89 y.o. female  DM, HTN, Seizures on Keppra  500mg  BID, dementia, wheelchair bound  who presents from home for on and off seizures for about 1 hour at home.Son states she has not missed any doses     Recommendations  -Seizure precautions  -Continue Keppra   750 mg BID  -Please reach out with any further concerns or questions.  _________________________________________________________________________________________________   Dawn Karna DELENA Waddell, NP [facility]  NEUROHOSPITALIST ADDENDUM Performed a face to face diagnostic evaluation.   I have reviewed the contents of history and physical exam as documented by PA/ARNP/Resident and agree with above documentation.  I have discussed and formulated the above plan as documented. Edits to the note have been made as needed.  Dawn Figeroa, MD [facility]

## 2024-09-05 NOTE — Plan of Care (Signed)

## 2024-09-05 NOTE — Consult Note (Signed)
 Consultation Note Date: 09/05/2024   Patient Name: Dawn [name]  DOB: 14-Jan-1935  MRN: [id number]  Age / Sex: 89 y.o., female  PCP: Sherrilee Graven, MD Referring Physician: Jillian Buttery, MD  Reason for Consultation: Establishing goals of care  HPI/Patient Profile: 89 y.o. female  with past medical history of seizure disorder, PRES Oct 2024 followed by home with hospice (d/c from hospice services due to improvements), cerebral amyloid angiopathy, HTN, diabetes, thyroid  mass (previously decided not to pursue work up) admitted on 09/04/2024 with seizure. Also found to have PE with possible right heart strain. MRI brain shows evidence of multiple scattered microhemorrhage consisted with cerebral amyloid angiopathy.   Clinical Assessment and Goals of Care: Consult received and chart review completed. I met today with Dawn [name]. She is having ECHO done at bedside. She is awake and restless during ECHO.   I returned to bedside when son arrived. We reviewed her status. They had hospice support at home beginning a year ago and she was doing well at home so d/c from hospice ~2 months ago per son. He was pleased with hospice support and would be open to this again. She was able to transfer from bed to wheelchair but not ambulatory. Son is primary caregiver and committed to her care telling me she comes first. He knows how dementia progresses as he has witnessed this in other family members. He shares that in the days to weeks leading up to her hospital stay she was not sleeping at all and this was a change. We discussed that this is not an issue at this time but we can consider how to better manage symptoms as indicated from here.   We reviewed concern for seizures, PE, and recommendation for NPO status. I explained to son risk of bleeding with anticoagulation due to her cerebral amyloid angiopathy but risk of  declining respiratory status if PE were to worsen. He agrees with not pursuing anticoagulation at this time. We discussed risks vs benefits of feeding tube and both agree this would not be good for her. He would like to give her a little more time to see if she can become a little more alert to tolerate intake and to be able to take her medications. We did discuss the potential that she does not improve and the possibility of hospice facility placement - he understands and would be open to this. He is hopeful that she can awaken enough for intake and meds so she can return home. He agrees to restart support from hospice at home. He is open to recommendations based on how his mother progresses. He made his peace with her end of life last year when they initially went home with hospice.   All questions/concerns addressed. Emotional support provided.    Primary Decision Maker NEXT OF KIN Son Dawn [name] is primary caregiver and in contact with his siblings    SUMMARY OF RECOMMENDATIONS   - DNR - Time for outcomes - No anticoagulation or feeding tube - Open to hospice  facility vs hospice at home recommendations based on progress  Code Status/Advance Care Planning: DNR   Symptom Management:  Per attending  Prognosis:  Overall prognosis poor  Discharge Planning: To Be Determined      Primary Diagnoses: Present on Admission: **None**   I have reviewed the medical record, interviewed the patient and family, and examined the patient. The following aspects are pertinent.  Past Medical History:  Diagnosis Date   Asthma    Diabetes mellitus without complication (HCC)    Hypertension    Social History   Socioeconomic History   Marital status: Widowed    Spouse name: Not on file   Number of children: Not on file   Years of education: Not on file   Highest education level: Not on file  Occupational History   Not on file  Tobacco Use   Smoking status: Former   Smokeless tobacco:  Former    Quit date: 12/05/1992  Vaping Use   Vaping status: Never Used  Substance and Sexual Activity   Alcohol use: No   Drug use: No   Sexual activity: Never  Other Topics Concern   Not on file  Social History Narrative   Not on file   Social Drivers of Health   Financial Resource Strain: Not on file  Food Insecurity: Patient Unable To Answer (09/05/2024)   Hunger Vital Sign    Worried About Running Out of Food in the Last Year: Patient unable to answer    Ran Out of Food in the Last Year: Patient unable to answer  Transportation Needs: Patient Unable To Answer (09/05/2024)   PRAPARE - Transportation    Lack of Transportation (Medical): Patient unable to answer    Lack of Transportation (Non-Medical): Patient unable to answer  Physical Activity: Not on file  Stress: Not on file  Social Connections: Unknown (09/05/2024)   Social Connection and Isolation Panel    Frequency of Communication with Friends and Family: Patient unable to answer    Frequency of Social Gatherings with Friends and Family: Patient unable to answer    Attends Religious Services: Patient unable to answer    Active Member of Clubs or Organizations: Patient unable to answer    Attends Banker Meetings: Patient unable to answer    Marital Status: Not on file   Family History  Problem Relation Age of Onset   Cancer Mother    Heart failure Father    Scheduled Meds:  levETIRAcetam   750 mg Intravenous Q12H   Continuous Infusions: PRN Meds:.labetalol Allergies  Allergen Reactions   Penicillins Anaphylaxis    Has patient had a PCN reaction causing immediate rash, facial/tongue/throat swelling, SOB or lightheadedness with hypotension: Yes Has patient had a PCN reaction causing severe rash involving mucus membranes or skin necrosis: Yes Has patient had a PCN reaction that required hospitalization No Has patient had a PCN reaction occurring within the last 10 years: No If all of the above  answers are NO, then may proceed with Cephalosporin use.   Shellfish Allergy Anaphylaxis and Swelling   Review of Systems  Unable to perform ROS: Dementia    Physical Exam Vitals and nursing note reviewed.  Constitutional:      General: She is sleeping. She is not in acute distress.    Appearance: She is cachectic. She is ill-appearing.  Cardiovascular:     Rate and Rhythm: Normal rate.  Pulmonary:     Effort: No tachypnea, accessory muscle usage or respiratory distress.  Abdominal:     General: Abdomen is flat.  Neurological:     Mental Status: She is lethargic.     Vital Signs: BP (!) 149/68 (BP Location: Right Arm)   Pulse 63   Temp 99.1 F (37.3 C) (Oral)   Resp 20   Wt 57.5 kg   SpO2 98%   BMI 21.76 kg/m  Pain Scale: Faces   Pain Score: Asleep   SpO2: SpO2: 98 % O2 Device:SpO2: 98 % O2 Flow Rate: .O2 Flow Rate (L/min): 2 L/min  IO: Intake/output summary: No intake or output data in the 24 hours ending 09/05/24 0834  LBM:   Baseline Weight: Weight: 57.5 kg Most recent weight: Weight: 57.5 kg     Palliative Assessment/Data:    Time Total: 60 min  Greater than 50%  of this time was spent counseling and coordinating care related to the above assessment and plan.  Signed by: Bernarda Kitty, NP Palliative Medicine Team Pager # [phone] (M-F 8a-5p) Team Phone # [phone] (Nights/Weekends)

## 2024-09-05 NOTE — Plan of Care (Signed)
 Talking at this time. Not sure what's being said. Son in to visit. Palliative in to assess today.     Problem: Education: Goal: Knowledge of General Education information will improve Description: Including pain rating scale, medication(s)/side effects and non-pharmacologic comfort measures Outcome: Progressing   Problem: Activity: Goal: Risk for activity intolerance will decrease Outcome: Progressing   Problem: Nutrition: Goal: Adequate nutrition will be maintained Outcome: Progressing   Problem: Safety: Goal: Ability to remain free from injury will improve Outcome: Progressing   Problem: Skin Integrity: Goal: Risk for impaired skin integrity will decrease Outcome: Progressing

## 2024-09-05 NOTE — Evaluation (Signed)
 Clinical/Bedside Swallow Evaluation Patient Details  Name: FALYNN AILEY MRN: [id number] Date of Birth: [birth year]  Today's Date: 09/05/2024 Time: SLP Start Time (ACUTE ONLY): 1543 SLP Stop Time (ACUTE ONLY): 1553 SLP Time Calculation (min) (ACUTE ONLY): 10 min  Past Medical History:  Past Medical History:  Diagnosis Date   Asthma    Diabetes mellitus without complication (HCC)    Hypertension    Past Surgical History:  Past Surgical History:  Procedure Laterality Date   BOWEL RESECTION  11/12/2012   Procedure: SMALL BOWEL RESECTION;  Surgeon: Morene ONEIDA Olives, MD;  Location: [facility] OR;  Service: General;  Laterality: N/A;   LAPAROTOMY  11/12/2012   Procedure: EXPLORATORY LAPAROTOMY;  Surgeon: Morene ONEIDA Olives, MD;  Location: [facility] OR;  Service: General;  Laterality: N/A;   HPI:  Antwan Bribiesca is an 89 year old female who was admitted with suspected seizure; family reports she missed Keppra  dose. Dx acute encephalopathy secondary to seizure; PE. PMHx dementia, diabetes type 2, hypertension, seizure disorder. CTA did not show any acute intracranial abnormalities.  Pt lives with son; followed by OP hospice.    Assessment / Plan / Recommendation  Clinical Impression  Pt with poor participation in swallow evaluation.  Recommend NPO pending improvements in mental status. Poorly rousable; not f/c; not verbalizing. Oral care was provided.  Dentures in place - pt grimacing, unable to remove them for cleaning. She did not demonstrate reaction to ice being placed against lips; 1/2 tspns of water were accepted with appreciable swallow response, but quality of swallow and adequacy of airway protection unknown.  Continue NPO; SLP will follow for readiness/improvements. D/W RN.   SLP Visit Diagnosis: Dysphagia, unspecified (R13.10)    Aspiration Risk       Diet Recommendation   NPO       Other  Recommendations Oral Care Recommendations: Oral care QID     Assistance Recommended at  Discharge    Functional Status Assessment Patient has had a recent decline in their functional status and demonstrates the ability to make significant improvements in function in a reasonable and predictable amount of time.  Frequency and Duration min 2x/week  2 weeks       Prognosis Prognosis for improved oropharyngeal function: Fair Barriers to Reach Goals: Cognitive deficits      Swallow Study   General Date of Onset: 09/04/24 HPI: Kalyn Hofstra is an 89 year old female who was admitted with suspected seizure; family reports she missed Keppra  dose. Dx acute encephalopathy secondary to seizure; PE. PMHx dementia, diabetes type 2, hypertension, seizure disorder. CTA did not show any acute intracranial abnormalities.  Pt lives with son; followed by OP hospice. Type of Study: Bedside Swallow Evaluation Previous Swallow Assessment: clinical swallow eval 08/2023 Diet Prior to this Study: Clear liquid diet Temperature Spikes Noted: No Respiratory Status: Room air History of Recent Intubation: No Behavior/Cognition: Lethargic/Drowsy Oral Cavity Assessment: Within Functional Limits Oral Care Completed by SLP: Yes Oral Cavity - Dentition: Dentures, top;Dentures, bottom Self-Feeding Abilities: Total assist Patient Positioning: Upright in bed Baseline Vocal Quality: Not observed Volitional Cough: Cognitively unable to elicit Volitional Swallow: Unable to elicit    Oral/Motor/Sensory Function Overall Oral Motor/Sensory Function: Other (comment) (symmetry at baseline)   Ice Chips Ice chips: Impaired Presentation: Spoon Oral Phase Impairments: Poor awareness of bolus   Thin Liquid Thin Liquid: Impaired Presentation: Spoon Pharyngeal  Phase Impairments:  (swallow present, quality unknown)    Nectar Thick Nectar Thick Liquid: Not tested   Honey  Thick Honey Thick Liquid: Not tested   Puree Puree: Not tested   Solid     Solid: Not tested      Vona Palma  Laurice 09/05/2024,3:03 PM  Palma L. Vona, MA CCC/SLP Clinical Specialist - Acute Care SLP Acute Rehabilitation Services Office number [phone]

## 2024-09-05 NOTE — Progress Notes (Signed)
 PROGRESS NOTE  Dawn [name]  FMW:[id number] DOB: 24-May-1935 DOA: 09/04/2024 PCP: Sherrilee Graven, MD   Brief Narrative: Patient presented with a 89 year old  female with history of dementia, diabetes type 2, hypertension, seizure disorder who presented with acute encephalopathy, suspected seizure episode.  She lives with her family at her home.  She takes Keppra  for the history of seizures.  As per the report from family, she missed her Keppra  dose.  On presentation, she was somnolent.  CTA did not show any acute intracranial abnormalities.  CTA head and neck  did not show any large vessel occlusion but showed filling defects in the right pulmonary artery and right lower branches concerning for pulmonary emboli.  Patient loaded with Keppra , neurology consulted  Assessment & Plan:  Principal Problem:   Seizure (HCC)  Acute encephalopathy secondary to seizure/seizure disorder: Started on Keppra  7500 mg IV twice daily.  Neurology following.  History of amyloid  angiopathy, dementia.  MRI brain showed no acute findings,extensive chronic microvascular ischemic changes,numerous scattered foci of chronic microhemorrhage throughout the cerebral hemispheres, most characteristic of cerebral amyloid angiopathy. As per the son, she was compliant with Keppra  at home. She was found to be sleeping .  Could be postictal as well.  Started on clear liquid diet.  Will consult speech therapy  PE: CT angiogram of head and neck concerning for PE.  Patient is asymptomatic.  CT angiogram of the chest with PE protocol showed nonocclusive pulmonary emboli at the bifurcation of the right main pulmonary artery extending into the lobar branches of the upper and lower lobes.Possible mild right heart strain with RV/LV ratio of 1.1.  She is not symptomatic.  She is on room air. Will get echocardiogram.  I discussed this situation with his son at bedside.  Patient has  high risk for bleeding while on anticoagulation.  She  has dementia.  Since patient is asymptomatic,son does not want to start on anticoagulation.  Thyroid  mass: Present since 10/24  Hypertension: Takes ARB at home.  Currently blood pressure stable.  Antihypertensives on hold  Goals of care/dementia: Patient is mostly nonambulatory at home.  Lives with son.  Mostly oriented at home.  Follows with outpatient hospice.  Perative care consulted for goals of care.  CODE STATUS DNR        Wound 09/04/24 2138 Pressure Injury Coccyx Medial Stage 2 -  Partial thickness loss of dermis presenting as a shallow open injury with a red, pink wound bed without slough. (Active)    DVT prophylaxis:Place and maintain sequential compression device Start: 09/05/24 0221     Code Status: Limited: Do not attempt resuscitation (DNR) -DNR-LIMITED -Do Not Intubate/DNI   Family Communication: Discussed with son at bedside on 10/14  Patient status:Inpatient  Patient is from :home  Anticipated discharge un:ynfz  Estimated DC date:1--2 days   Consultants: Neurology  Procedures:None  Antimicrobials:  Anti-infectives (From admission, onward)    None       Subjective: Patient seen and examined at the bedside today.  Lying on bed.  She was sleeping.  Not in any apparent distress.  Had a long discussion held with the son at bedside about goals of care.  He does not want to start on anticoagulation.  She is on room air.  Objective: Vitals:   09/05/24 0306 09/05/24 0358 09/05/24 0600 09/05/24 0733  BP: (!) 156/96 134/74 (!) 140/72 (!) 157/75  Pulse: 65 72 67 60  Resp:  (!) 24  19  Temp:  99.5 F (37.5 C)  99.1 F (37.3 C)  TempSrc:  Axillary  Oral  SpO2: 98% 100% 100% 100%  Weight:       No intake or output data in the 24 hours ending 09/05/24 0800 Filed Weights   09/04/24 2332  Weight: 57.5 kg    Examination:  General exam: Overall comfortable, not in distress, lying on bed, sleeping Respiratory system:  no wheezes or crackles   Cardiovascular system: S1 & S2 heard, RRR.  Gastrointestinal system: Abdomen is nondistended, soft and nontender. Central nervous system: Sleeping Extremities: No edema, no clubbing ,no cyanosis Skin: No rashes, no ulcers,no icterus     Data Reviewed: I have personally reviewed following labs and imaging studies  CBC: Recent Labs  Lab 09/04/24 1206 09/04/24 1223 09/04/24 2046  WBC  --  4.4 5.3  NEUTROABS  --  3.8  --   HGB 12.9 13.4 14.9  HCT 38.0 40.8 45.6  MCV  --  91.3 91.9  PLT  --  246 157   Basic Metabolic Panel: Recent Labs  Lab 09/04/24 1206 09/04/24 2346  NA 142 140  K 4.4 4.1  CL 102 100  CO2  --  26  GLUCOSE 121* 109*  BUN 7* <5*  CREATININE 1.00 0.93  CALCIUM  --  9.8     No results found for this or any previous visit (from the past 240 hours).   Radiology Studies:   Scheduled Meds:  levETIRAcetam   750 mg Intravenous Q12H   Continuous Infusions:   LOS: 1 day   Ivonne Mustache, MD [facility] P10/14/2025, 8:00 AM

## 2024-09-06 DIAGNOSIS — R569 Unspecified convulsions: Secondary | ICD-10-CM | POA: Diagnosis not present

## 2024-09-06 DIAGNOSIS — Z515 Encounter for palliative care: Secondary | ICD-10-CM | POA: Diagnosis not present

## 2024-09-06 DIAGNOSIS — Z7189 Other specified counseling: Secondary | ICD-10-CM | POA: Diagnosis not present

## 2024-09-06 MED ORDER — HYDRALAZINE HCL 20 MG/ML IJ SOLN
10.0000 mg | Freq: Three times a day (TID) | INTRAMUSCULAR | Status: DC | PRN
  Administered 2024-09-06 – 2024-09-07 (×2): 10 mg via INTRAVENOUS
  Filled 2024-09-06 (×3): qty 1

## 2024-09-06 NOTE — Plan of Care (Signed)
 More talkative and alert.  Following some commands and smiling often.  No seizure activity and no signs of distress.    Problem: Education: Goal: Knowledge of General Education information will improve Description: Including pain rating scale, medication(s)/side effects and non-pharmacologic comfort measures Outcome: Progressing   Problem: Clinical Measurements: Goal: Will remain free from infection Outcome: Progressing   Problem: Clinical Measurements: Goal: Diagnostic test results will improve Outcome: Progressing   Problem: Clinical Measurements: Goal: Respiratory complications will improve Outcome: Progressing   Problem: Activity: Goal: Risk for activity intolerance will decrease Outcome: Progressing   Problem: Nutrition: Goal: Adequate nutrition will be maintained Outcome: Progressing   Problem: Coping: Goal: Level of anxiety will decrease Outcome: Progressing

## 2024-09-06 NOTE — Progress Notes (Signed)
 Palliative:  HPI: 89 y.o. 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 Required intubation 8/12.   I met today with 3 daughters, sister, brother, and Inge Lecher NP PCCM. We had discussion regarding path forward for one way extubation. We reviewed poor prognosis and expectation that Dawn [name] will not do well once extubated. Family reiterate goal that he not suffer. We reviewed option to have medication available if he is having distress vs having medication already onboard to prevent distress. Family would like to have medication onboard to minimize any suffering during transition off ventilator. They would like to move forward with extubation later today and will have the rest of the family come to visit prior to extubation.   I reviewed plans and symptom management recommendations with RN.   Update: I was present with family prior to additional support as well as throughout extubation. Family appropriately tearful. Assisted to ensure comfort. I left family to visit privately with Dawn [name] after he is resting comfortably after extubation.   All questions/concerns addressed. Emotional support provided. Much time coordinating care with RN, PCCM, and family.   Exam: Sedated on vent. FiO2 50%. Breathing regular, unlabored on vent. No distress. Not following commands. Abd soft. Warm to touch.   Plan:  - DNR - Extubated to full comfort care - Anticipate hospital death  100 min  Bernarda Kitty, NP Palliative Medicine Team Pager [phone] (Please see [email or web address] for schedule) Team Phone [phone]

## 2024-09-06 NOTE — Progress Notes (Signed)
 PROGRESS NOTE  Dawn [name]  FMW:[id number] DOB: 05-Jun-1935 DOA: 09/04/2024 PCP: Sherrilee Graven, MD   Brief Narrative: Patient presented with a 89 year old  female with history of dementia, diabetes type 2, hypertension, seizure disorder who presented with acute encephalopathy, suspected seizure episode.  She lives with her family at her home.  She takes Keppra  for the history of seizures.  As per the report from family, she missed her Keppra  dose.  On presentation, she was somnolent.  CTA did not show any acute intracranial abnormalities.  CTA head and neck  did not show any large vessel occlusion but showed filling defects in the right pulmonary artery and right lower branches concerning for pulmonary emboli.  Patient loaded with Keppra , neurology consulted.  Patient remains somnolent.  Palliative care consulted for goals of care.  Depending upon the improvement in her mentation.  Family will decide on pursuing residential hospice or continuing hospice services at home  Assessment & Plan:  Principal Problem:   Seizure [facility])  Acute encephalopathy secondary to seizure/seizure disorder: Started on Keppra  750 mg IV twice daily.  Neurology following.  History of amyloid  angiopathy, dementia.  MRI brain showed no acute findings,extensive chronic microvascular ischemic changes,numerous scattered foci of chronic microhemorrhage throughout the cerebral hemispheres, most characteristic of cerebral amyloid angiopathy. As per the son, she was compliant with Keppra  at home. She remains encephalopathic.  Could not wake up on calling her name.  Not in any current distress.  Hemodynamically stable.  Speech therapy recommending NPO.  Continue gentle IV fluid.  PE: CT angiogram of head and neck concerning for PE.  Patient is asymptomatic.  CT angiogram of the chest with PE protocol showed nonocclusive pulmonary emboli at the bifurcation of the right main pulmonary artery extending into the lobar branches  of the upper and lower lobes.Possible mild right heart strain with RV/LV ratio of 1.1.  She is not symptomatic.  She is on room air. Will get echocardiogram.  I discussed this situation with his son at bedside.  Patient has  high risk for bleeding while on anticoagulation.  She has dementia.  Since patient is asymptomatic,son does not want to start on anticoagulation.  Thyroid  mass: Present since 10/24  Hypertension: Takes ARB at home.  Currently NPO.  Continue as needed medication for severe hypertension  Goals of care/dementia: Patient is mostly nonambulatory at home.  Lives with son.  Mostly oriented at home.  Follows with outpatient hospice.  Perative care consulted for goals of care.  CODE STATUS DNR        Wound 09/04/24 2138 Pressure Injury Coccyx Medial Stage 2 -  Partial thickness loss of dermis presenting as a shallow open injury with a red, pink wound bed without slough. (Active)    DVT prophylaxis:Place and maintain sequential compression device Start: 09/05/24 0221     Code Status: Limited: Do not attempt resuscitation (DNR) -DNR-LIMITED -Do Not Intubate/DNI   Family Communication: Discussed with son at bedside on 10/14  Patient status:Inpatient  Patient is from :home  Anticipated discharge un:ynfz vs residential hospice  Estimated DC date:1--2 days   Consultants: Neurology  Procedures:None  Antimicrobials:  Anti-infectives (From admission, onward)    None       Subjective: Patient seen and examined at the bedside today.  She was lying in bed.  Eyes closed.  She did not respond or open her eyes on calling her name.  Hemodynamically stable.  On room air.  Not in any kind of  distress  Objective: Vitals:   09/05/24 2049 09/05/24 2352 09/06/24 0342 09/06/24 0800  BP: (!) 189/80 (!) 159/70 (!) 179/71   Pulse: 65 63 61 (!) 56  Resp: 16 16 16    Temp: 99.7 F (37.6 C) 99 F (37.2 C) 98.6 F (37 C) 98.1 F (36.7 C)  TempSrc: Oral Oral Oral   SpO2: 93%  92% 92%   Weight:       No intake or output data in the 24 hours ending 09/06/24 1118 Filed Weights   09/04/24 2332  Weight: 57.5 kg    Examination:   General exam: Overall comfortable, not in distress, eyes closed, lying on bed HEENT: PERRL Respiratory system:  no wheezes or crackles  Cardiovascular system: S1 & S2 heard, RRR.  Gastrointestinal system: Abdomen is nondistended, soft and nontender. Central nervous system: Somnolent Extremities: No edema, no clubbing ,no cyanosis Skin: No rashes, no ulcers,no icterus       Data Reviewed: I have personally reviewed following labs and imaging studies  CBC: Recent Labs  Lab 09/04/24 1206 09/04/24 1223 09/04/24 2046  WBC  --  4.4 5.3  NEUTROABS  --  3.8  --   HGB 12.9 13.4 14.9  HCT 38.0 40.8 45.6  MCV  --  91.3 91.9  PLT  --  246 157   Basic Metabolic Panel: Recent Labs  Lab 09/04/24 1206 09/04/24 2346  NA 142 140  K 4.4 4.1  CL 102 100  CO2  --  26  GLUCOSE 121* 109*  BUN 7* <5*  CREATININE 1.00 0.93  CALCIUM  --  9.8     No results found for this or any previous visit (from the past 240 hours).   Radiology Studies:   Scheduled Meds:  levETIRAcetam   750 mg Intravenous Q12H   Continuous Infusions:  dextrose  5% lactated ringers  75 mL/hr at 09/06/24 0256     LOS: 2 days   Ivonne Mustache, MD [facility] P10/15/2025, 11:18 AM

## 2024-09-06 NOTE — Progress Notes (Signed)
 Speech Language Pathology Treatment: Dysphagia  Patient Details Name: Dawn [name] MRN: [id number] DOB: [birth year] Today's Date: 09/06/2024 Time: 1449-1510 SLP Time Calculation (min) (ACUTE ONLY): 21 min  Assessment / Plan / Recommendation Clinical Impression  Pt is a bit more alert and was able to drink/eat with full assistance. Rec starting dysphagia 1 diet/thin liquids; crush meds. Careful hand-feeding.    Pt's son was at the bedside. Pt did not speak despite cues.  She was unable to coordinate to draw liquid through a straw but received cup edge at lips and consumed 2 oz juice with no s/s of aspiration. She accepted tspns of pudding from her son with encouragement to swallow; was able to do so safely but required 1:1 help and max verbal cues.  SLP will follow.    HPI HPI: Dawn [name] is an 89 year old female who was admitted with suspected seizure; family reports she missed Keppra  dose. Dx acute encephalopathy secondary to seizure; PE. PMHx dementia, diabetes type 2, hypertension, seizure disorder. CTA did not show any acute intracranial abnormalities.  Pt lives with son; followed by OP hospice.      SLP Plan  Continue with current plan of care          Recommendations  Diet recommendations: Thin liquid;Dysphagia 1 (puree) Liquids provided via: Cup;Straw Medication Administration: Crushed with puree Supervision: Trained caregiver to feed patient Compensations: Minimize environmental distractions Postural Changes and/or Swallow Maneuvers: Seated upright 90 degrees                  Oral care BID   Frequent or constant Supervision/Assistance Dysphagia, unspecified (R13.10)     Continue with current plan of care   Dawn Klare L. Vona, MA CCC/SLP Clinical Specialist - Acute Care SLP Acute Rehabilitation Services Office number [phone]   Dawn [name]  09/06/2024, 3:20 PM

## 2024-09-06 NOTE — TOC Initial Note (Signed)
 Transition of Care [facility]) - Initial/Assessment Note    Patient Details  Name: Dawn [name] MRN: [id number] Date of Birth: 31-Jul-1935  Transition of Care [facility]) CM/SW Contact:    Andrez JULIANNA George, RN Phone Number: 09/06/2024, 3:10 PM  Clinical Narrative:                 Dawn [name] is a 89 y.o. female with medical history significant of DM2, HTN and seizure disorder.  Pt is from home with her son. Pts daughter is with her if the son needs to be out of the home.  Family provides her medications and any needed transportation. IP Care management following.  Expected Discharge Plan:  (TBD) Barriers to Discharge: Continued Medical Work up   Patient Goals and CMS Choice            Expected Discharge Plan and Services       Living arrangements for the past 2 months: Single Family Home                                      Prior Living Arrangements/Services Living arrangements for the past 2 months: Single Family Home Lives with:: Adult Children Patient language and need for interpreter reviewed:: Yes          Care giver support system in place?: Yes (comment) Current home services: DME (hospital bed/ wheelchair/ shower seat) Criminal Activity/Legal Involvement Pertinent to Current Situation/Hospitalization: No - Comment as needed  Activities of Daily Living   ADL Screening (condition at time of admission) Independently performs ADLs?: No Does the patient have a NEW difficulty with bathing/dressing/toileting/self-feeding that is expected to last >3 days?: No Does the patient have a NEW difficulty with getting in/out of bed, walking, or climbing stairs that is expected to last >3 days?: No Does the patient have a NEW difficulty with communication that is expected to last >3 days?: No Is the patient deaf or have difficulty hearing?: No (UTA. Pt is unresponsive) Does the patient have difficulty seeing, even when wearing glasses/contacts?: No (UTA. Pt is  unresponsive) Does the patient have difficulty concentrating, remembering, or making decisions?: Yes  Permission Sought/Granted                  Emotional Assessment Appearance:: Appears stated age   Affect (typically observed): Quiet     Psych Involvement: No (comment)  Admission diagnosis:  Seizure (HCC) [R56.9] Altered mental status, unspecified altered mental status type [R41.82] Acute pulmonary embolism, unspecified pulmonary embolism type, unspecified whether acute cor pulmonale present [facility]) [I26.99] Patient Active Problem List   Diagnosis Date Noted   Seizure (HCC) 09/04/2024   Hypertension 09/05/2023   Malnutrition of moderate degree 09/05/2023   Focal seizure (HCC) 09/02/2023   Poor nutrition 09/02/2023   Thyroid  mass 09/02/2023   AMS (altered mental status) 09/01/2023   SBO (small bowel obstruction) (HCC) 11/14/2012   PCP:  Sherrilee Graven, MD Pharmacy:   [facility] [phone] [address], [place] - [address] [place] [address] Phone: [phone] Fax: [phone]     Social Drivers of Health (SDOH) Social History: SDOH Screenings   Food Insecurity: Patient Unable To Answer (09/05/2024)  Housing: Patient Unable To Answer (09/05/2024)  Transportation Needs: Patient Unable To Answer (09/05/2024)  Utilities: Patient Unable To Answer (09/05/2024)  Social Connections: Unknown (09/05/2024)  Tobacco Use: Medium Risk (09/04/2024)   SDOH Interventions:  Readmission Risk Interventions     No data to display

## 2024-09-07 ENCOUNTER — Other Ambulatory Visit (HOSPITAL_COMMUNITY): Payer: Self-pay

## 2024-09-07 DIAGNOSIS — Z7189 Other specified counseling: Secondary | ICD-10-CM | POA: Diagnosis not present

## 2024-09-07 DIAGNOSIS — R569 Unspecified convulsions: Secondary | ICD-10-CM | POA: Diagnosis not present

## 2024-09-07 DIAGNOSIS — Z515 Encounter for palliative care: Secondary | ICD-10-CM | POA: Diagnosis not present

## 2024-09-07 MED ORDER — IRBESARTAN 150 MG PO TABS
150.0000 mg | ORAL_TABLET | Freq: Every day | ORAL | Status: DC
  Administered 2024-09-07 – 2024-09-08 (×2): 150 mg via ORAL
  Filled 2024-09-07 (×2): qty 1

## 2024-09-07 MED ORDER — LEVETIRACETAM 750 MG PO TABS
750.0000 mg | ORAL_TABLET | Freq: Two times a day (BID) | ORAL | 0 refills | Status: AC
  Filled 2024-09-07: qty 120, 60d supply, fill #0

## 2024-09-07 NOTE — Discharge Summary (Addendum)
 Physician Discharge Summary  [name] FMW:[id number] DOB: February 05, 1935 DOA: 09/04/2024  PCP: Sherrilee Graven, MD  Admit date: 09/04/2024 Discharge date: 09/08/2024  Admitted From: Home Disposition:  Home with hospice  Discharge Condition:Stable CODE STATUS:FULL Diet recommendation: Dysphagia 1  Brief/Interim Summary: Patient presented with a 89 year old  female with history of dementia, diabetes type 2, hypertension, seizure disorder who presented with acute encephalopathy, suspected seizure episode.  She lives with her family at her home.  She takes Keppra  for the history of seizures.  As per the report from family, she missed her Keppra  dose.  On presentation, she was somnolent.  CTA did not show any acute intracranial abnormalities.  CTA head and neck  did not show any large vessel occlusion but showed filling defects in the right pulmonary artery and right lower branches concerning for pulmonary emboli.  Patient loaded with Keppra , neurology consulted.  Palliative care consulted for goals of care. Decision made to discharge home with hospice after goals of care discussion.  Following problems were addressed during the hospitalization:  Acute encephalopathy secondary to seizure/seizure disorder: Started on Keppra  750 mg IV twice daily.  Neurology following.  History of amyloid  angiopathy, dementia.  MRI brain showed no acute findings,extensive chronic microvascular ischemic changes,numerous scattered foci of chronic microhemorrhage throughout the cerebral hemispheres, most characteristic of cerebral amyloid angiopathy. As per the son, she was compliant with Keppra  at home. Mentation has improved today.  She is not somnolent.  Speech therapy started her on dysphagia 1 diet.  Continue ceftriaxone repeat milligram twice daily at home.   PE: CT angiogram of head and neck concerning for PE.  Patient is asymptomatic.  CT angiogram of the chest with PE protocol showed nonocclusive  pulmonary emboli at the bifurcation of the right main pulmonary artery extending into the lobar branches of the upper and lower lobes.Possible mild right heart strain with RV/LV ratio of 1.1.  She is not symptomatic.  She is on room air. Will get echocardiogram.  I discussed this situation with his son at bedside.  Patient has  high risk for bleeding while on anticoagulation.  She has dementia.  Since patient is asymptomatic,son does not want to start on anticoagulation.   Thyroid  mass: Present since 10/24   Hypertension: Takes ARB at home.  Continue  Goals of care/dementia: Patient is mostly nonambulatory at home.  Lives with son.  Mostly oriented at home.  Follows with outpatient hospice.  Palliative care consulted for goals of care.  CODE STATUS DNR.  Plan to discharge to home with hospice after arrangement of DME       Discharge Diagnoses:  Principal Problem:   Seizure [facility])    Discharge Instructions  Discharge Instructions     Diet general   Complete by: As directed    Dysphagia 1 diet   Discharge instructions   Complete by: As directed    1)Please follow up with hospice services 2)Continue taking your seizure medication   Increase activity slowly   Complete by: As directed    No wound care   Complete by: As directed       Allergies as of 09/08/2024       Reactions   Penicillins Anaphylaxis   Has patient had a PCN reaction causing immediate rash, facial/tongue/throat swelling, SOB or lightheadedness with hypotension: Yes Has patient had a PCN reaction causing severe rash involving mucus membranes or skin necrosis: Yes Has patient had a PCN reaction that required hospitalization No Has  patient had a PCN reaction occurring within the last 10 years: No If all of the above answers are NO, then may proceed with Cephalosporin use.   Shellfish Allergy Anaphylaxis, Swelling        Medication List     TAKE these medications    feeding supplement Liqd Take 237  mLs by mouth 3 (three) times daily between meals.   irbesartan  150 MG tablet Commonly known as: AVAPRO  Take 1 tablet (150 mg total) by mouth daily.   levETIRAcetam  750 MG tablet Commonly known as: KEPPRA  Take 1 tablet (750 mg total) by mouth 2 (two) times daily. What changed:  medication strength how much to take   traMADol  50 MG tablet Commonly known as: ULTRAM  Take 25 mg by mouth every 6 (six) hours as needed (for pain).        Allergies  Allergen Reactions   Penicillins Anaphylaxis    Has patient had a PCN reaction causing immediate rash, facial/tongue/throat swelling, SOB or lightheadedness with hypotension: Yes Has patient had a PCN reaction causing severe rash involving mucus membranes or skin necrosis: Yes Has patient had a PCN reaction that required hospitalization No Has patient had a PCN reaction occurring within the last 10 years: No If all of the above answers are NO, then may proceed with Cephalosporin use.   Shellfish Allergy Anaphylaxis and Swelling    Consultations: Neurology, palliative care   Procedures/Studies:    Subjective: Patient seen and examined at the bedside today.  Lying on bed.  Hemodynamically stable.  Alert and awake but not oriented.  Not in any kind of distress.  Family decided on hospice services at home.  Discharge Exam: Vitals:   09/08/24 0330 09/08/24 0734  BP: 128/62 (!) 165/74  Pulse: 74 69  Resp: 20 20  Temp: 98.1 F (36.7 C) 97.9 F (36.6 C)  SpO2: 99% 100%   Vitals:   09/07/24 2232 09/07/24 2338 09/08/24 0330 09/08/24 0734  BP: (!) 144/61 118/80 128/62 (!) 165/74  Pulse:  70 74 69  Resp: 20 18 20 20   Temp:  98.2 F (36.8 C) 98.1 F (36.7 C) 97.9 F (36.6 C)  TempSrc:  Oral Axillary Oral  SpO2:  99% 99% 100%  Weight:        General: Pt is alert, awake, not in acute distress, disoriented, chronically deconditioned Cardiovascular: RRR, S1/S2 +, no rubs, no gallops Respiratory: CTA bilaterally, no wheezing,  no rhonchi Abdominal: Soft, NT, ND, bowel sounds + Extremities: no edema, no cyanosis    The results of significant diagnostics from this hospitalization (including imaging, microbiology, ancillary and laboratory) are listed below for reference.     Microbiology: No results found for this or any previous visit (from the past 240 hours).   Labs: BNP (last 3 results) No results for input(s): BNP in the last 8760 hours. Basic Metabolic Panel: Recent Labs  Lab 09/04/24 1206 09/04/24 2346  NA 142 140  K 4.4 4.1  CL 102 100  CO2  --  26  GLUCOSE 121* 109*  BUN 7* <5*  CREATININE 1.00 0.93  CALCIUM  --  9.8   Liver Function Tests: Recent Labs  Lab 09/04/24 2346  AST 22  ALT 10  ALKPHOS 51  BILITOT 0.9  PROT 7.7  ALBUMIN  3.8   No results for input(s): LIPASE, AMYLASE in the last 168 hours. Recent Labs  Lab 09/04/24 1218  AMMONIA 45*   CBC: Recent Labs  Lab 09/04/24 1206 09/04/24 1223 09/04/24 2046  WBC  --  4.4 5.3  NEUTROABS  --  3.8  --   HGB 12.9 13.4 14.9  HCT 38.0 40.8 45.6  MCV  --  91.3 91.9  PLT  --  246 157   Cardiac Enzymes: No results for input(s): CKTOTAL, CKMB, CKMBINDEX, TROPONINI in the last 168 hours. BNP: Invalid input(s): POCBNP CBG: Recent Labs  Lab 09/04/24 1235  GLUCAP 112*   D-Dimer No results for input(s): DDIMER in the last 72 hours.  Hgb A1c No results for input(s): HGBA1C in the last 72 hours. Lipid Profile No results for input(s): CHOL, HDL, LDLCALC, TRIG, CHOLHDL, LDLDIRECT in the last 72 hours. Thyroid  function studies No results for input(s): TSH, T4TOTAL, T3FREE, THYROIDAB in the last 72 hours.  Invalid input(s): FREET3 Anemia work up No results for input(s): VITAMINB12, FOLATE, FERRITIN, TIBC, IRON, RETICCTPCT in the last 72 hours. Urinalysis    Component Value Date/Time   COLORURINE STRAW (A) 09/04/2024 1200   APPEARANCEUR CLEAR 09/04/2024 1200    LABSPEC 1.025 09/04/2024 1200   PHURINE 8.0 09/04/2024 1200   GLUCOSEU NEGATIVE 09/04/2024 1200   HGBUR SMALL (A) 09/04/2024 1200   BILIRUBINUR NEGATIVE 09/04/2024 1200   KETONESUR NEGATIVE 09/04/2024 1200   PROTEINUR 100 (A) 09/04/2024 1200   UROBILINOGEN 1.0 11/10/2012 2324   NITRITE NEGATIVE 09/04/2024 1200   LEUKOCYTESUR NEGATIVE 09/04/2024 1200   Sepsis Labs Recent Labs  Lab 09/04/24 1223 09/04/24 2046  WBC 4.4 5.3   Microbiology No results found for this or any previous visit (from the past 240 hours).  Please note: You were cared for by a hospitalist during your hospital stay. Once you are discharged, your primary care physician will handle any further medical issues. Please note that NO REFILLS for any discharge medications will be authorized once you are discharged, as it is imperative that you return to your primary care physician (or establish a relationship with a primary care physician if you do not have one) for your post hospital discharge needs so that they can reassess your need for medications and monitor your lab values.    Time coordinating discharge: 40 minutes  SIGNED:   Ivonne Mustache, MD  [facility] 09/08/2024, 10:14 AM Pager [phone]  If 7PM-7AM, please contact night-coverage [email or web address] Password [email or web address]

## 2024-09-07 NOTE — Plan of Care (Signed)

## 2024-09-07 NOTE — Care Management Important Message (Signed)
 Important Message  Patient Details  Name: Dawn [name] MRN: [id number] Date of Birth: 1935-08-24   Important Message Given:  Yes - Medicare IM     [name] 09/07/2024, 3:12 PM

## 2024-09-07 NOTE — Progress Notes (Signed)
 Palliative:  HPI: 89 y.o. female  with past medical history of seizure disorder, PRES Oct 2024 followed by home with hospice (d/c from hospice services due to improvements), cerebral amyloid angiopathy, HTN, diabetes, thyroid  mass (previously decided not to pursue work up) admitted on 09/04/2024 with seizure. Also found to have PE with possible right heart strain. MRI brain shows evidence of multiple scattered microhemorrhage consisted with cerebral amyloid angiopathy.    I met today at Dawn [name]'s bedside. No family or visitors currently present. She is awake and smiling with baseline confusion. She does speak and tells me hi.   I called and discussed with son, Cathlyn. We reviewed her status and that she is more awake and approved for diet. He shares that she ate a few bites for him yesterday. We discussed the potential of decreased intake and that she may not meet the needs of her body due to her underlying dementia. He shares that this was a problem last year when they d/c home with hospice. We reviewed that IF she is going to eat more she will most likely do her best at home as she did before. We discussed that we do not have good options for treatment if she does not eat/drink well. We discussed use of hospice to help him keep her comfortable at home in the event she continues with poor intake. We also discussed her PE and risk of worsening and respiratory symptoms or decline. We discussed how hospice can be prepared to keep her comfortable if this occurs as well. We reviewed why hospice discharged her previously and the changes and risks of complication and even death are much higher now than they were 1-2 months ago. [name] verbalizes understanding and agrees with hospice support at home. He previously had [facility] and I asked if he wished to connect back with them or consider a different hospice agency and he wishes to connect back with [facility]. I did share that his mother is likely nearing  discharge readiness as there is not much more we can do for her here that cannot be provided at home. [name] verbalizes understanding.   All questions/concerns addressed. Emotional support provided. Discussed with attending and TOC.   Exam: Thin, frail, cachectic. No distress. Breathing regular, unlabored on room air. Abd soft, flat. Moves her arms. Baseline confusion.    Plan: - DNR - Home with hospice - Consider PRN intranasal midazolam for breakthrough seizures at home - Recommend roxanol 5 mg q4h PRN for shortness of breath in the event of worsening due to PE  40 min  Bernarda Kitty, NP Palliative Medicine Team Pager [phone] (Please see [email or web address] for schedule) Team Phone [phone]

## 2024-09-07 NOTE — TOC Progression Note (Signed)
 Transition of Care [facility]) - Progression Note    Patient Details  Name: LEMON WHITACRE MRN: [id number] Date of Birth: 11-19-1935  [facility]) CM/SW Contact  Andrez JULIANNA George, RN Phone Number: 09/07/2024, 12:20 PM  Clinical Narrative:     Family has decided to have pt discharge home with [facility] hospice services. Referral sent to [facility].  IP Care management following.  Expected Discharge Plan:  (TBD) Barriers to Discharge: Continued Medical Work up               Expected Discharge Plan and Services       Living arrangements for the past 2 months: Single Family Home                                       Social Drivers of Health (SDOH) Interventions SDOH Screenings   Food Insecurity: Patient Unable To Answer (09/05/2024)  Housing: Patient Unable To Answer (09/05/2024)  Transportation Needs: Patient Unable To Answer (09/05/2024)  Utilities: Patient Unable To Answer (09/05/2024)  Social Connections: Unknown (09/05/2024)  Tobacco Use: Medium Risk (09/04/2024)    Readmission Risk Interventions     No data to display

## 2024-09-07 NOTE — Plan of Care (Signed)
°  Problem: Education: °Goal: Knowledge of General Education information will improve °Description: Including pain rating scale, medication(s)/side effects and non-pharmacologic comfort measures °Outcome: Not Progressing °  °Problem: Health Behavior/Discharge Planning: °Goal: Ability to manage health-related needs will improve °Outcome: Not Progressing °  °Problem: Clinical Measurements: °Goal: Ability to maintain clinical measurements within normal limits will improve °Outcome: Not Progressing ° PATIENT IS CONFUSED °

## 2024-09-07 NOTE — Progress Notes (Signed)
 [facility] [phone] [facility] Liaison Note  Received request from [facility] for hospice services at home after discharge. Spoke with patient's son, Cathlyn to initiate education related to hospice philosophy, services and team approach to care. Son verbalized understanding of information given. Per discussion, the plan is for discharge home tomorrow.   DME needs discussed. Patient has the following equipment in the home: hospital bed, Wheelchair, [facility] Family requests the following equipment for delivery: none  Please send signed and completed DNR home with patient/family. Please provide prescriptions at discharge as needed to ensure ongoing symptom management.  [facility] information and contact numbers given to [place]. Please call with any concerns.  Thank you for the opportunity to participate in this patient's care.   Eleanor Nail, LPN [facility] Liaison [phone]

## 2024-09-08 ENCOUNTER — Other Ambulatory Visit (HOSPITAL_COMMUNITY): Payer: Self-pay

## 2024-09-08 NOTE — Plan of Care (Signed)

## 2024-09-08 NOTE — Progress Notes (Signed)
 Speech Language Pathology Treatment: Dysphagia  Patient Details Name: Dawn [name] MRN: [id number] DOB: [birth year] Today's Date: 09/08/2024 Time: [id number] SLP Time Calculation (min) (ACUTE ONLY): 10 min  Assessment / Plan / Recommendation Clinical Impression  Pt is alert and communicating this morning although perseverative on good morning. When assisted with self-feeding, swallowing with thin liquids appears to be swift and functional. There is more impact from cognition on awareness with purees via spoon, as pt tends to purse her lips as if anticipating a straw. Mod cues are given to help her get the purees into her oral cavity, but then she swallows with increased automaticity. Concern with this is more so about adequate nutrition, with no overt signs of aspiration noted across consistencies.   Recommend that pt continue with purees and thin liquids with careful assistance from caregivers during meals to facilitate safety and encourage intake. SLP to sign off acutely.    HPI HPI: Dawn [name] is an 89 year old female who was admitted with suspected seizure; family reports she missed Keppra  dose. Dx acute encephalopathy secondary to seizure; PE. PMHx dementia, diabetes type 2, hypertension, seizure disorder. CTA did not show any acute intracranial abnormalities.  Pt lives with son; followed by OP hospice.      SLP Plan  All goals met          Recommendations  Diet recommendations: Dysphagia 1 (puree);Thin liquid Liquids provided via: Cup;Straw Medication Administration: Crushed with puree Supervision: Trained caregiver to feed patient Compensations: Minimize environmental distractions Postural Changes and/or Swallow Maneuvers: Seated upright 90 degrees                  Oral care BID     Dysphagia, unspecified (R13.10)     All goals met     Leita SAILOR., M.A. CCC-SLP Acute Rehabilitation Services Office: [phone]  Secure chat preferred   09/08/2024,  12:11 PM

## 2024-09-08 NOTE — Progress Notes (Signed)
 Patient seen and examined at bedside today.  She is alert and awake and communicates this morning.  Disoriented but not in any kind of distress.  Plan is to discharge her home with hospice.  No change in the medical management.  Discharge summary and orders already on place

## 2024-09-08 NOTE — TOC Transition Note (Signed)
 Transition of Care [facility]) - Discharge Note   Patient Details  Name: Dawn [name] MRN: [id number] Date of Birth: November 02, 1935  Transition of Care [facility]) CM/SW Contact:  Andrez JULIANNA George, RN Phone Number: 09/08/2024, 1:00 PM   Clinical Narrative:     Pt is discharging home with hospice through [facility]. [facility] aware of d/c home. [facility] to assess pt prior to leaving the hospital.  Son is transporting patient home today.   Final next level of care: Home w Hospice Care Barriers to Discharge: No Barriers Identified   Patient Goals and CMS Choice   CMS Medicare.gov Compare Post Acute Care list provided to:: Patient Represenative (must comment) Choice offered to / list presented to : Adult Children      Discharge Placement                       Discharge Plan and Services Additional resources added to the After Visit Summary for                                       Social Drivers of Health (SDOH) Interventions SDOH Screenings   Food Insecurity: Patient Unable To Answer (09/05/2024)  Housing: Patient Unable To Answer (09/05/2024)  Transportation Needs: Patient Unable To Answer (09/05/2024)  Utilities: Patient Unable To Answer (09/05/2024)  Social Connections: Unknown (09/05/2024)  Tobacco Use: Medium Risk (09/04/2024)     Readmission Risk Interventions     No data to display

## 2024-09-08 NOTE — Plan of Care (Signed)
  Problem: Education: Goal: Knowledge of General Education information will improve Description: Including pain rating scale, medication(s)/side effects and non-pharmacologic comfort measures 09/08/2024 1315 by Jenel Bobetta SAILOR, RN Outcome: Adequate for Discharge 09/08/2024 0848 by Jenel Bobetta SAILOR, RN Outcome: Progressing   Problem: Health Behavior/Discharge Planning: Goal: Ability to manage health-related needs will improve 09/08/2024 1315 by Jenel Bobetta SAILOR, RN Outcome: Adequate for Discharge 09/08/2024 0848 by Jenel Bobetta SAILOR, RN Outcome: Progressing   Problem: Clinical Measurements: Goal: Ability to maintain clinical measurements within normal limits will improve 09/08/2024 1315 by Jenel Bobetta SAILOR, RN Outcome: Adequate for Discharge 09/08/2024 0848 by Jenel Bobetta SAILOR, RN Outcome: Progressing Goal: Will remain free from infection 09/08/2024 1315 by Jenel Bobetta SAILOR, RN Outcome: Adequate for Discharge 09/08/2024 0848 by Jenel Bobetta SAILOR, RN Outcome: Progressing Goal: Diagnostic test results will improve 09/08/2024 1315 by Jenel Bobetta SAILOR, RN Outcome: Adequate for Discharge 09/08/2024 0848 by Jenel Bobetta SAILOR, RN Outcome: Progressing Goal: Respiratory complications will improve 09/08/2024 1315 by Jenel Bobetta SAILOR, RN Outcome: Adequate for Discharge 09/08/2024 0848 by Jenel Bobetta SAILOR, RN Outcome: Progressing Goal: Cardiovascular complication will be avoided 09/08/2024 1315 by Jenel Bobetta SAILOR, RN Outcome: Adequate for Discharge 09/08/2024 0848 by Jenel Bobetta SAILOR, RN Outcome: Progressing   Problem: Activity: Goal: Risk for activity intolerance will decrease 09/08/2024 1315 by Jenel Bobetta SAILOR, RN Outcome: Adequate for Discharge 09/08/2024 0848 by Jenel Bobetta SAILOR, RN Outcome: Progressing   Problem: Nutrition: Goal: Adequate nutrition will be maintained 09/08/2024 1315 by Jenel Bobetta SAILOR, RN Outcome: Adequate for Discharge 09/08/2024 0848 by Jenel Bobetta SAILOR, RN Outcome:  Progressing   Problem: Coping: Goal: Level of anxiety will decrease 09/08/2024 1315 by Jenel Bobetta SAILOR, RN Outcome: Adequate for Discharge 09/08/2024 0848 by Jenel Bobetta SAILOR, RN Outcome: Progressing   Problem: Elimination: Goal: Will not experience complications related to bowel motility 09/08/2024 1315 by Jenel Bobetta SAILOR, RN Outcome: Adequate for Discharge 09/08/2024 0848 by Jenel Bobetta SAILOR, RN Outcome: Progressing Goal: Will not experience complications related to urinary retention 09/08/2024 1315 by Jenel Bobetta SAILOR, RN Outcome: Adequate for Discharge 09/08/2024 0848 by Jenel Bobetta SAILOR, RN Outcome: Progressing   Problem: Pain Managment: Goal: General experience of comfort will improve and/or be controlled 09/08/2024 1315 by Jenel Bobetta SAILOR, RN Outcome: Adequate for Discharge 09/08/2024 0848 by Jenel Bobetta SAILOR, RN Outcome: Progressing   Problem: Safety: Goal: Ability to remain free from injury will improve 09/08/2024 1315 by Jenel Bobetta SAILOR, RN Outcome: Adequate for Discharge 09/08/2024 0848 by Jenel Bobetta SAILOR, RN Outcome: Progressing   Problem: Skin Integrity: Goal: Risk for impaired skin integrity will decrease 09/08/2024 1315 by Jenel Bobetta SAILOR, RN Outcome: Adequate for Discharge 09/08/2024 0848 by Jenel Bobetta SAILOR, RN Outcome: Progressing

## 2024-10-23 DEATH — deceased
